# Patient Record
Sex: Male | Born: 1950 | Race: White | Hispanic: No | Marital: Married | Smoking: Never smoker
Health system: Southern US, Community
[De-identification: ages and names within clinical notes are randomized; demographics above are authoritative.]

## PROBLEM LIST (undated history)

## (undated) DIAGNOSIS — K5790 Diverticulosis of intestine, part unspecified, without perforation or abscess without bleeding: Secondary | ICD-10-CM

## (undated) DIAGNOSIS — Z87442 Personal history of urinary calculi: Secondary | ICD-10-CM

## (undated) DIAGNOSIS — I10 Essential (primary) hypertension: Secondary | ICD-10-CM

## (undated) DIAGNOSIS — R0789 Other chest pain: Secondary | ICD-10-CM

## (undated) DIAGNOSIS — I2510 Atherosclerotic heart disease of native coronary artery without angina pectoris: Secondary | ICD-10-CM

## (undated) DIAGNOSIS — M51369 Other intervertebral disc degeneration, lumbar region without mention of lumbar back pain or lower extremity pain: Secondary | ICD-10-CM

## (undated) DIAGNOSIS — K4020 Bilateral inguinal hernia, without obstruction or gangrene, not specified as recurrent: Secondary | ICD-10-CM

## (undated) DIAGNOSIS — I700 Atherosclerosis of aorta: Secondary | ICD-10-CM

## (undated) DIAGNOSIS — Z7901 Long term (current) use of anticoagulants: Secondary | ICD-10-CM

## (undated) DIAGNOSIS — M109 Gout, unspecified: Secondary | ICD-10-CM

## (undated) DIAGNOSIS — K760 Fatty (change of) liver, not elsewhere classified: Secondary | ICD-10-CM

## (undated) DIAGNOSIS — N400 Enlarged prostate without lower urinary tract symptoms: Secondary | ICD-10-CM

## (undated) DIAGNOSIS — Z7982 Long term (current) use of aspirin: Secondary | ICD-10-CM

## (undated) DIAGNOSIS — R918 Other nonspecific abnormal finding of lung field: Secondary | ICD-10-CM

## (undated) DIAGNOSIS — Z789 Other specified health status: Secondary | ICD-10-CM

## (undated) DIAGNOSIS — K449 Diaphragmatic hernia without obstruction or gangrene: Secondary | ICD-10-CM

## (undated) DIAGNOSIS — K8050 Calculus of bile duct without cholangitis or cholecystitis without obstruction: Secondary | ICD-10-CM

## (undated) DIAGNOSIS — Z8673 Personal history of transient ischemic attack (TIA), and cerebral infarction without residual deficits: Secondary | ICD-10-CM

## (undated) DIAGNOSIS — E785 Hyperlipidemia, unspecified: Secondary | ICD-10-CM

## (undated) HISTORY — DX: Personal history of transient ischemic attack (TIA), and cerebral infarction without residual deficits: Z86.73

## (undated) HISTORY — PX: MELANOMA EXCISION: SHX5266

---

## 2010-01-04 DIAGNOSIS — C4359 Malignant melanoma of other part of trunk: Secondary | ICD-10-CM

## 2010-01-04 HISTORY — DX: Malignant melanoma of other part of trunk: C43.59

## 2011-04-07 DIAGNOSIS — L280 Lichen simplex chronicus: Secondary | ICD-10-CM

## 2011-04-07 HISTORY — DX: Lichen simplex chronicus: L28.0

## 2012-04-06 DIAGNOSIS — I639 Cerebral infarction, unspecified: Secondary | ICD-10-CM

## 2012-04-06 DIAGNOSIS — C4492 Squamous cell carcinoma of skin, unspecified: Secondary | ICD-10-CM

## 2012-04-06 HISTORY — DX: Cerebral infarction, unspecified: I63.9

## 2012-04-06 HISTORY — DX: Squamous cell carcinoma of skin, unspecified: C44.92

## 2012-05-18 ENCOUNTER — Ambulatory Visit: Payer: Self-pay | Admitting: General Surgery

## 2012-05-20 LAB — PATHOLOGY REPORT

## 2013-01-30 ENCOUNTER — Inpatient Hospital Stay: Payer: Self-pay | Admitting: Internal Medicine

## 2013-01-30 ENCOUNTER — Ambulatory Visit: Payer: Self-pay | Admitting: Family Medicine

## 2013-01-30 DIAGNOSIS — I639 Cerebral infarction, unspecified: Secondary | ICD-10-CM

## 2013-01-30 HISTORY — DX: Cerebral infarction, unspecified: I63.9

## 2013-01-30 LAB — COMPREHENSIVE METABOLIC PANEL
Albumin: 3.9 g/dL (ref 3.4–5.0)
Alkaline Phosphatase: 79 U/L (ref 50–136)
Anion Gap: 7 (ref 7–16)
BUN: 18 mg/dL (ref 7–18)
Bilirubin,Total: 0.3 mg/dL (ref 0.2–1.0)
Calcium, Total: 8.8 mg/dL (ref 8.5–10.1)
Chloride: 108 mmol/L — ABNORMAL HIGH (ref 98–107)
Co2: 24 mmol/L (ref 21–32)
EGFR (African American): >60
Glucose: 108 mg/dL — ABNORMAL HIGH (ref 65–99)
Osmolality: 280 (ref 275–301)
Potassium: 4 mmol/L (ref 3.5–5.1)
SGOT(AST): 21 U/L (ref 15–37)
SGPT (ALT): 31 U/L (ref 12–78)
Total Protein: 7.5 g/dL (ref 6.4–8.2)

## 2013-01-30 LAB — CBC WITH DIFFERENTIAL/PLATELET
Eosinophil %: 1.8 %
HCT: 42.7 % (ref 40.0–52.0)
HGB: 14.6 g/dL (ref 13.0–18.0)
Lymphocyte %: 16.1 %
MCH: 30 pg (ref 26.0–34.0)
MCHC: 34.1 g/dL (ref 32.0–36.0)
Monocyte #: 0.6 x10 3/mm (ref 0.2–1.0)
Neutrophil %: 74.3 %
Platelet: 267 10*3/uL (ref 150–440)
RBC: 4.86 10*6/uL (ref 4.40–5.90)
RDW: 13.1 % (ref 11.5–14.5)

## 2013-01-30 LAB — CBC
HGB: 15 g/dL (ref 13.0–18.0)
MCHC: 34.3 g/dL (ref 32.0–36.0)
Platelet: 280 10*3/uL (ref 150–440)
RBC: 5.05 10*6/uL (ref 4.40–5.90)
WBC: 8.8 10*3/uL (ref 3.8–10.6)

## 2013-01-30 LAB — BASIC METABOLIC PANEL
Anion Gap: 6 — ABNORMAL LOW (ref 7–16)
Chloride: 106 mmol/L (ref 98–107)
Co2: 27 mmol/L (ref 21–32)
EGFR (African American): >60
EGFR (Non-African Amer.): >60
Glucose: 97 mg/dL (ref 65–99)
Osmolality: 279 (ref 275–301)
Potassium: 3.9 mmol/L (ref 3.5–5.1)
Sodium: 139 mmol/L (ref 136–145)

## 2013-01-30 LAB — CK TOTAL AND CKMB (NOT AT ARMC): CK-MB: 1.6 ng/mL (ref 0.5–3.6)

## 2013-01-30 LAB — TROPONIN I: Troponin-I: <0.02 ng/mL

## 2013-01-31 DIAGNOSIS — I517 Cardiomegaly: Secondary | ICD-10-CM

## 2013-01-31 DIAGNOSIS — I5189 Other ill-defined heart diseases: Secondary | ICD-10-CM

## 2013-01-31 HISTORY — DX: Other ill-defined heart diseases: I51.89

## 2013-01-31 LAB — CBC WITH DIFFERENTIAL/PLATELET
Basophil #: 0 10*3/uL (ref 0.0–0.1)
Basophil %: 0.6 %
Eosinophil %: 3.1 %
HCT: 40.5 % (ref 40.0–52.0)
HGB: 14.2 g/dL (ref 13.0–18.0)
Lymphocyte #: 1.3 10*3/uL (ref 1.0–3.6)
Lymphocyte %: 17.7 %
MCV: 87 fL (ref 80–100)
Monocyte %: 9.7 %
Neutrophil %: 68.9 %
RBC: 4.66 10*6/uL (ref 4.40–5.90)
RDW: 13 % (ref 11.5–14.5)
WBC: 7.2 10*3/uL (ref 3.8–10.6)

## 2013-01-31 LAB — LIPID PANEL
Cholesterol: 154 mg/dL (ref 0–200)
VLDL Cholesterol, Calc: 23 mg/dL (ref 5–40)

## 2013-01-31 LAB — HEMOGLOBIN A1C: Hemoglobin A1C: 5.4 % (ref 4.2–6.3)

## 2013-01-31 LAB — BASIC METABOLIC PANEL
Anion Gap: 3 — ABNORMAL LOW (ref 7–16)
Chloride: 107 mmol/L (ref 98–107)
Osmolality: 279 (ref 275–301)

## 2013-01-31 LAB — TSH: Thyroid Stimulating Horm: 1.35 u[IU]/mL

## 2014-02-15 IMAGING — US US CAROTID DUPLEX BILAT
1 series · 14 of 24 positions shown · non-contrast
Comparison: none

Addendum Begins
REASON FOR EXAM: slurred speech
COMMENTS:

PROCEDURE:     US  - US CAROTID DOPPLER BILATERAL  - January 31, 2013  [DATE]
RESULT:
TECHNIQUE: Grayscale, duplex color flow Doppler, and spectral waveform
imaging was performed. Image documentation was also performed.

[Series 1: us carotid duplex bilat · 14 of 54 slices shown]
[im 1/54]
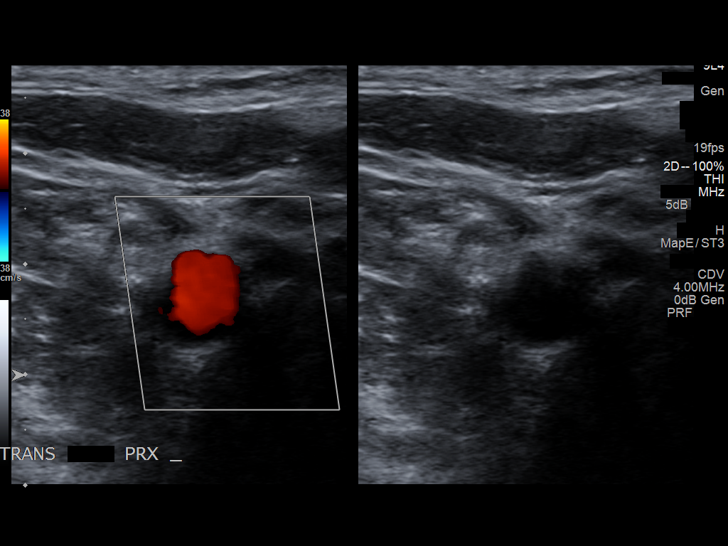
[im 5/54]
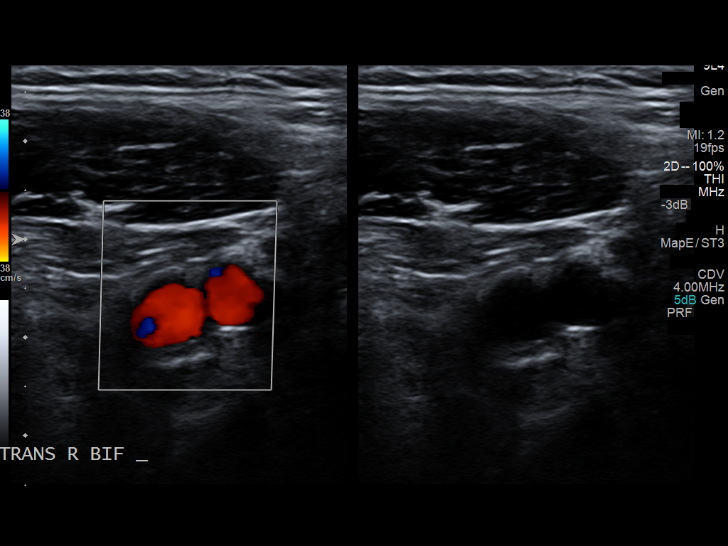
[im 10/54]
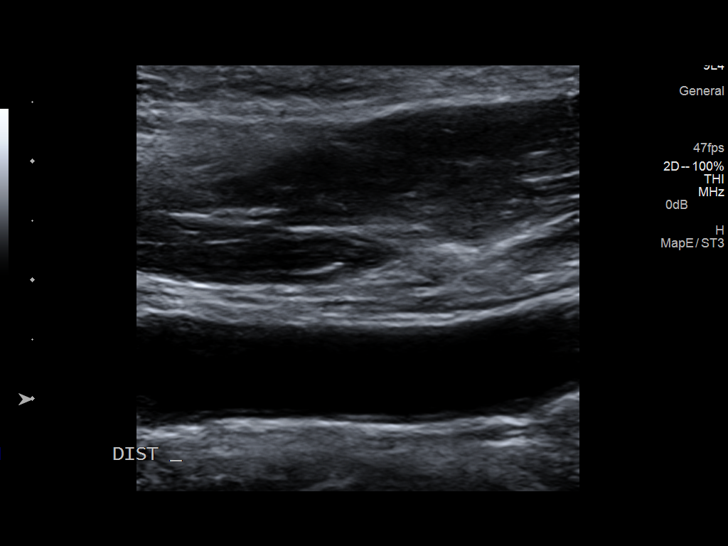
[im 14/54]
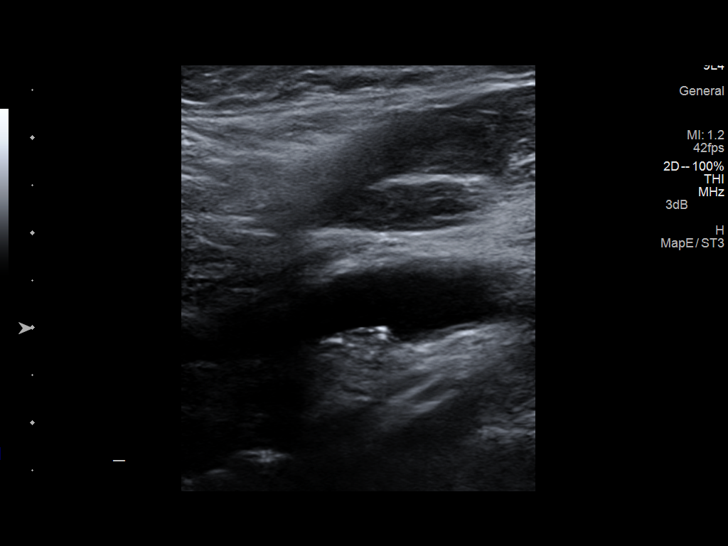
[im 17/54]
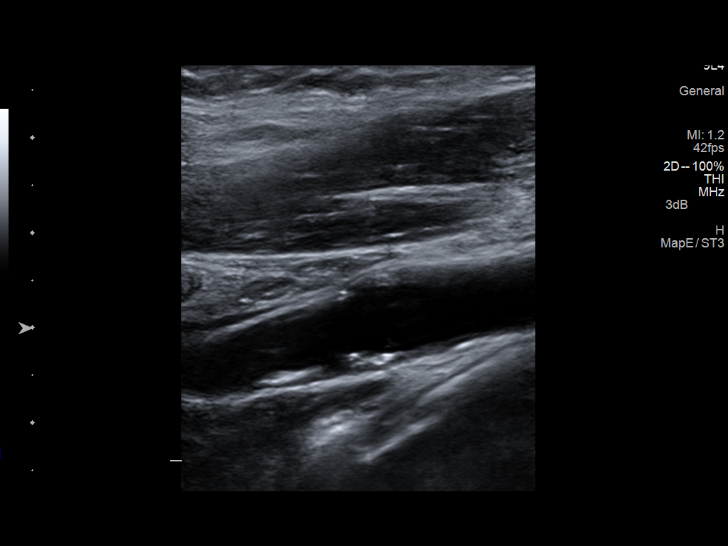
[im 21/54]
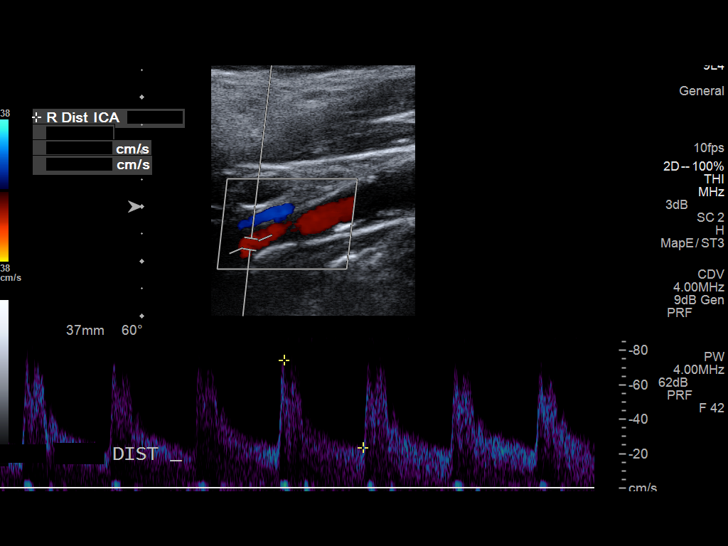
[im 26/54]
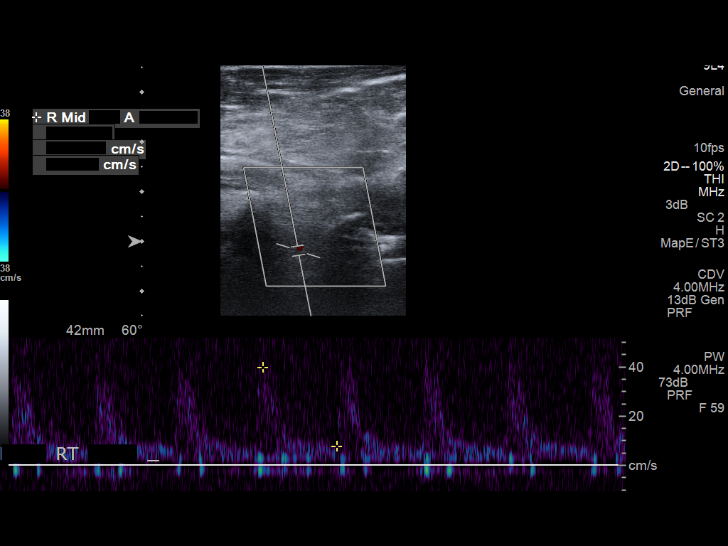
[im 28/54]
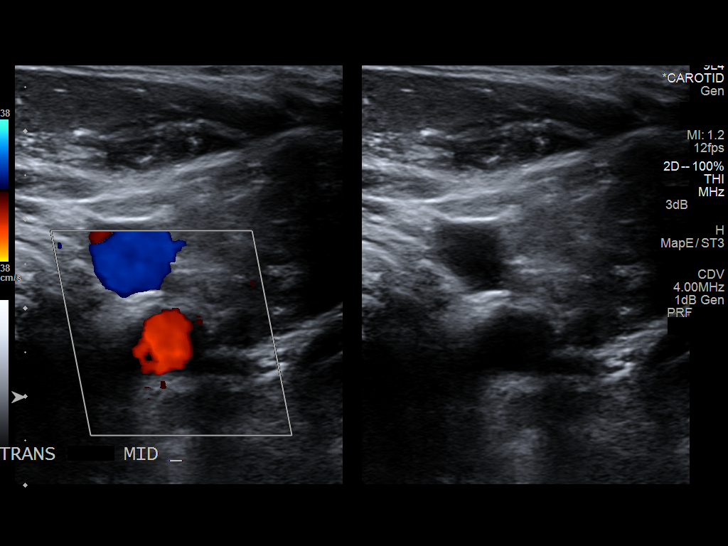
[im 33/54]
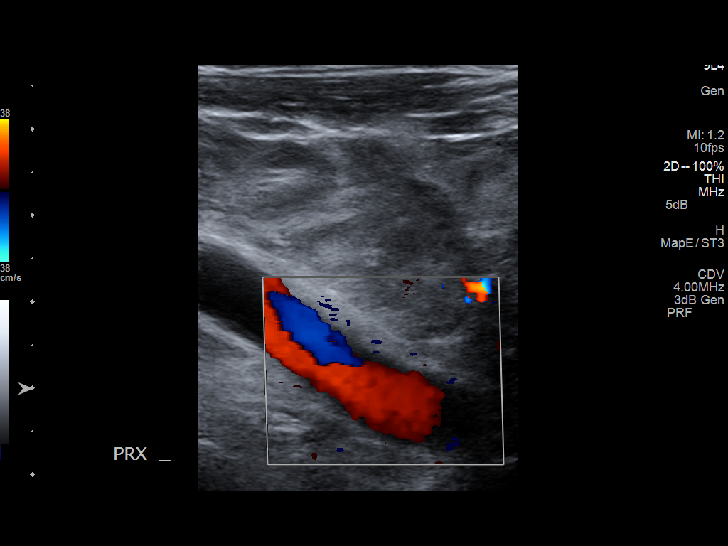
[im 37/54]
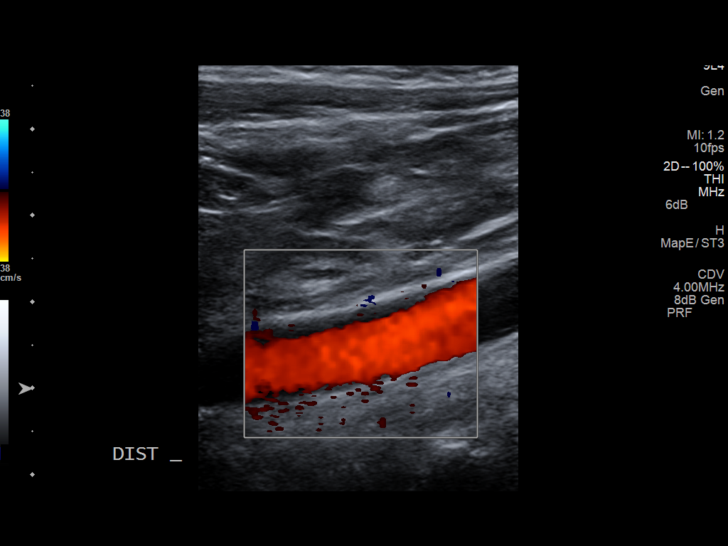
[im 42/54]
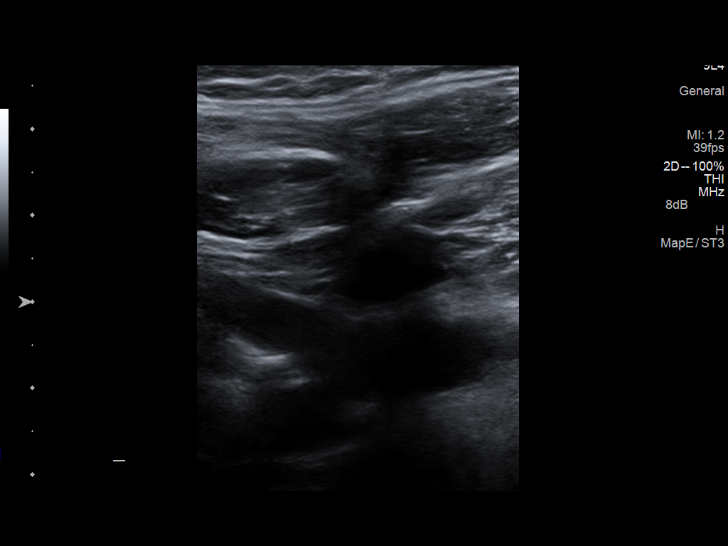
[im 44/54]
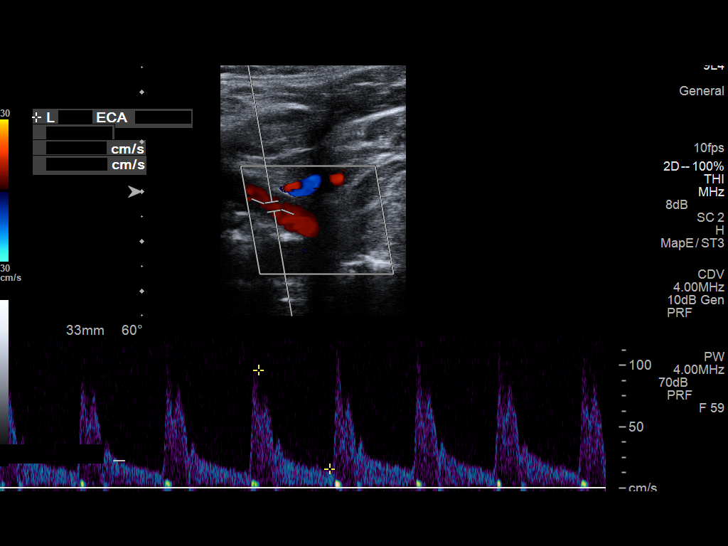
[im 49/54]
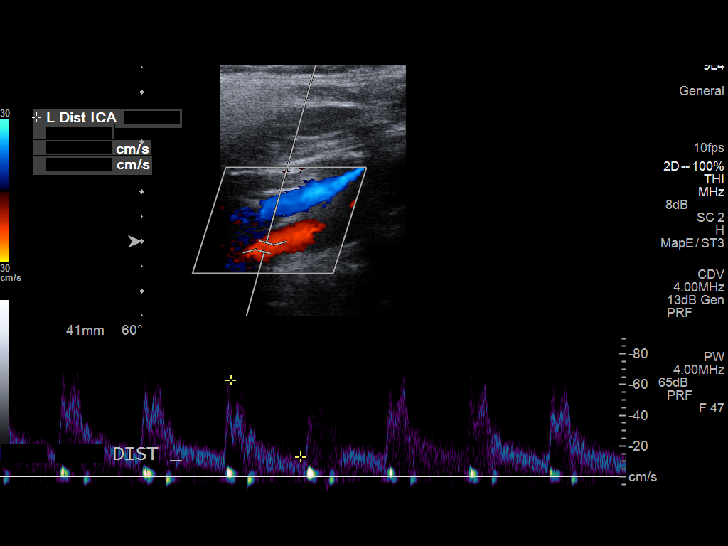
[im 54/54]
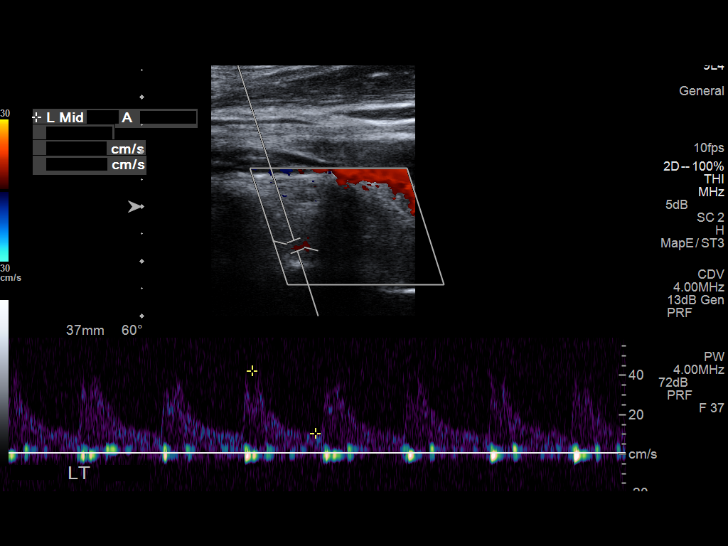

[14 of 24 positions shown; findings below may reference images not displayed]

FINDINGS: Asymmetric calcified plaques are identified within the carotid
bulb and internal carotid artery on the right and carotid bulb on the left.
There areas demonstrate less than 50% visual stenosis. Color filling and
spectral waveform imaging is unremarkable within the right and left carotid
systems.

ICA/CCA ratios:

RIGHT:

LEFT:

Antegrade flow is identified within the right and left vertebral arteries.
IMPRESSION: No sonographic evidence of hemodynamically significant
stenosis within the right or left carotid systems.

Addendum Ends

## 2014-07-27 NOTE — H&P (Signed)
PATIENT NAME:  Joseph Frye, Joseph Frye MR#:  [id number] DATE OF BIRTH:  01-20-1951  DATE OF ADMISSION:  01/30/2013  PRIMARY CARE PROVIDER: Jillene Bucks. Arlana Pouch, MD  REFERRING PHYSICIAN: Steele Sizer, MD  CHIEF COMPLAINT: Difficulty with speech.   HISTORY OF PRESENT ILLNESS: The patient is a 64 year old white male who states that he was previously healthy, was not on any medications except aspirin on a daily basis, who last Wednesday started developing difficulty with speech. He had difficulty expressing himself and could not say what he wanted to say.  Then his speech came back normal and then Saturday again had an episode of 20 minutes where he had difficulty with speech. He called his primary care provider yesterday. They told him to come to the office. 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 He was also noticed to have a blood pressure accelerated. Therefore, he was referred to the hospital for further evaluation. The patient reports that all his symptoms have resolved now. He does not have any weakness, does not have any speech changes, does not have any numbness. He is completely back to normal. The patient otherwise denies any difficulty swallowing or any visual difficulties. He denies any chest pain, shortness of breath or palpitations.   PAST MEDICAL HISTORY: No previous history of hypertension but was noted to have elevated blood pressure in the office and was started on Toprol today, which he has not filled yet.   PAST SURGICAL HISTORY: None.   ALLERGIES: None.   MEDICATION: He is on aspirin 81 mg, 1 tab p.o. daily.   SOCIAL HISTORY: History of smoking, quit 25 years ago. Drinks 6 to 7 beers daily. No drug use. He is retired.   FAMILY HISTORY: Father with CVA.   REVIEW OF SYSTEMS: CONSTITUTIONAL: Denies any weakness. No weight loss. No weight gain.  HEENT:  Denies any blurred vision. No cataracts. No glaucoma.  ENT: Denies any epistaxis, nasal drainage. No difficulty with swallowing. No difficulty hearing. No tinnitus.  CARDIOVASCULAR: Denies any chest pain, palpitations. No syncope. No arrhythmia.  PULMONARY: Denies any cough, wheezing, hemoptysis.  GASTROINTESTINAL: Denies any nausea, vomiting, diarrhea.  GENITOURINARY: Denies any frequency, urgency or hesitancy.  HEMATOLOGIC: Denies any anemia, easy bruisability or bleeding.  SKIN: Denies any rash.  LYMPHATICS: Denies any lymph node enlargement.  VASCULAR: Denies any claudication symptoms.  NEUROLOGIC: Denies any previous history of CVA or TIA or seizures.  PSYCHIATRIC: Denies anxiety, depression.   PHYSICAL EXAMINATION: VITAL SIGNS: Temperature 98.1, pulse 64, respirations 18, blood pressure 197/99, O2 97%.  GENERAL: The patient is a well-developed, well-nourished male in no acute distress.  HEENT: Head atraumatic, normocephalic. Pupils equally round, reactive to light and accommodation. There is no conjunctival pallor. No scleral icterus. Nasal exam shows no drainage or ulceration. Oropharynx is clear without any exudate.  NECK: Supple without any JVD. No carotid bruits.  CARDIOVASCULAR: Regular rate and rhythm. No murmurs, rubs, clicks or gallops. PMI is not displaced.   ABDOMEN: Soft, nontender, nondistended. Positive bowel sounds x 4. No hepatosplenomegaly.  SKIN: No rash.  LYMPHATICS: No lymph nodes palpable.  PSYCHIATRIC: Not anxious or depressed.  NEUROLOGIC: Awake, alert, oriented x 3. Cranial nerves II through XII grossly intact. Strength 5 out of 5 in all 4 extremities. Reflexes 2+.  Babinski downgoing.   EVALUATION: The only available evaluation that is  available is CT scan done earlier shows area consistent with a region of subacute to chronic infarcts in the basal ganglia region of the left.   ASSESSMENT AND PLAN: The patient is a 64 year old white male with no medical  history who last Wednesday developed expressive aphasia and then had recurrence of the symptoms on Saturday. CT scan is consistent with subacute stroke.  1.  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  The patient needs further evaluation including MRI of the brain. We will get carotid Dopplers, echocardiogram. Will place him on telemetry. Check EKG. He is on daily aspirin so I will change him to Aggrenox therapy.  2.  Accelerated hypertension. His stroke is greater than 48 hours ago so I will also start him on lisinopril. Will add p.r.n. hydralazine. We can add HCTZ as needed.  3.  Alcohol use daily. I will monitor for delirium tremens, place him on CIWA protocol.   TIME SPENT: 50 minutes spent on this patient.     ____________________________ Lacie ScottsShreyang H. Allena KatzPatel, MD shp:cs D: 01/30/2013 17:57:16 ET T: 01/30/2013 18:56:44 ET JOB#: [id number]  cc: Jaeger Trueheart H. Allena KatzPatel, MD, <Dictator> Charise CarwinSHREYANG H Judah Chevere MD ELECTRONICALLY SIGNED 01/31/2013 12:18

## 2014-07-27 NOTE — Discharge Summary (Signed)
PATIENT NAME:  Joseph RosebushBROWN, Emmanuell W MR#:  [id number] DATE OF BIRTH:  11-Dec-1950  DATE OF ADMISSION:  01/30/2013 DATE OF DISCHARGE:  01/31/2013  DISCHARGE DIAGNOSES: 1.  Acute left lenticular nucleus cerebrovascular accident.  2.  Hypertension.  3.  Elevated blood pressure without diagnosis of hypertension.  4.  Alcohol abuse.   CONSULTS: Dr. Katrinka BlazingSmith with neurology.   IMAGING STUDIES DONE: Include a CT scan of the head without contrast, which showed a region of subacute to chronic infarction within the basal ganglion region on the left side.   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 No significant stenosis of the carotids or vertebral arteries.     Echocardiogram showed normal EF. No thrombus.   ADMITTING HISTORY AND PHYSICAL: Please see the detailed H and P dictated by Dr. Allena KatzPatel on 01/30/2013. In brief, a 64 year old male patient presented to the hospital complaining of difficulty with speech, aphasia, which had resolved by the time the patient presented to the ER. CT scan of the head did show left basal ganglia area stroke, admitted to the hospitalist service.   HOSPITAL COURSE: 1.  Acute cerebral vascular accident: The patient had acute CVA found on the MRI in the left lenticular nucleus area, for which Dr. Katrinka BlazingSmith of neurology was contacted. After getting an MRI/MRA of the brain, he did have confirmation of his stroke on the MRI, which is acute, for which he let his blood pressure run high. No blood pressure medications were started for permissive hypertension.  The patient did have some stenosis in his posterior cerebral artery, which I discussed with Dr. Alverda SkeansMatt Smith, who suggested this can be treated medically. Prior to discharge, the patient's neurological examination shows motor strength of 5/5. No aphasia. Cranial nerves II through XII intact. The patient was started on Plavix, in addition to the  aspirin he takes, along with the statin medication, and he has been set up with an appointment with Dr. Sherryll BurgerShah of neurology for followup and primary care physician.   DISCHARGE MEDICATIONS: Include:  1.  Aspirin 81 mg daily.  2.  Plavix 75 mg daily.  3.  Lipitor 20 mg daily.   DISCHARGE INSTRUCTIONS: Low-sodium, low-fat diet. Activity as tolerated. I have explained to the patient to watch out for any strokelike symptoms and have a low threshold to return to the Emergency Room. Follow up with Dr. Sherryll BurgerShah of neurology.   Time spent on day of discharge in discharge activity was 40 minutes.    ____________________________ Molinda BailiffSrikar R. Absalom Aro, MD srs:dmm D: 02/01/2013 09:55:14 ET T: 02/01/2013 10:24:07 ET JOB#: [id number]  cc: Wardell HeathSrikar R. Ailish Prospero, MD, <Dictator> Hemang K. Sherryll BurgerShah, MD Orie FishermanSRIKAR R Verlean Allport MD ELECTRONICALLY SIGNED 02/01/2013 13:12

## 2022-06-23 LAB — LAB REPORT - SCANNED: EGFR: 78

## 2023-01-06 LAB — LAB REPORT - SCANNED
A1c: 6
EGFR: 91
HM Hepatitis Screen: NEGATIVE
PSA, FREE: 0.91

## 2023-01-23 ENCOUNTER — Emergency Department
Admission: EM | Admit: 2023-01-23 | Discharge: 2023-01-23 | Disposition: A | Discharge status: Home / Self Care | Payer: Medicare Other | Attending: Emergency Medicine | Admitting: Emergency Medicine

## 2023-01-23 ENCOUNTER — Emergency Department: Payer: Medicare Other

## 2023-01-23 ENCOUNTER — Other Ambulatory Visit: Payer: Self-pay

## 2023-01-23 DIAGNOSIS — Z8673 Personal history of transient ischemic attack (TIA), and cerebral infarction without residual deficits: Secondary | ICD-10-CM | POA: Insufficient documentation

## 2023-01-23 DIAGNOSIS — Z7902 Long term (current) use of antithrombotics/antiplatelets: Secondary | ICD-10-CM | POA: Diagnosis not present

## 2023-01-23 DIAGNOSIS — R0789 Other chest pain: Secondary | ICD-10-CM | POA: Insufficient documentation

## 2023-01-23 DIAGNOSIS — I10 Essential (primary) hypertension: Secondary | ICD-10-CM | POA: Insufficient documentation

## 2023-01-23 HISTORY — DX: Essential (primary) hypertension: I10

## 2023-01-23 LAB — BASIC METABOLIC PANEL
Anion gap: 8 (ref 5–15)
BUN: 19 mg/dL (ref 8–23)
CO2: 25 mmol/L (ref 22–32)
Calcium: 9.3 mg/dL (ref 8.9–10.3)
Chloride: 104 mmol/L (ref 98–111)
Creatinine, Ser: 0.93 mg/dL (ref 0.61–1.24)
GFR, Estimated: >60 mL/min (ref >60)
Glucose, Bld: 149 mg/dL — ABNORMAL HIGH (ref 70–99)
Potassium: 4 mmol/L (ref 3.5–5.1)
Sodium: 137 mmol/L (ref 135–145)

## 2023-01-23 LAB — CBC
HCT: 46.3 % (ref 39.0–52.0)
Hemoglobin: 15.8 g/dL (ref 13.0–17.0)
MCH: 29.7 pg (ref 26.0–34.0)
MCHC: 34.1 g/dL (ref 30.0–36.0)
MCV: 87 fL (ref 80.0–100.0)
Platelets: 251 10*3/uL (ref 150–400)
RBC: 5.32 MIL/uL (ref 4.22–5.81)
RDW: 12.3 % (ref 11.5–15.5)
WBC: 6 10*3/uL (ref 4.0–10.5)
nRBC: 0 % (ref 0.0–0.2)

## 2023-01-23 LAB — TROPONIN I (HIGH SENSITIVITY)
Troponin I (High Sensitivity): 9 ng/L (ref <18)
Troponin I (High Sensitivity): 11 ng/L (ref <18)

## 2023-01-23 NOTE — Discharge Instructions (Addendum)
We placed a referral to cardiology so you should get a phone call to schedule an appointment to be seen in the clinic.  If you have any worsening symptoms, please return to the ED.

## 2023-01-23 NOTE — ED Provider Notes (Signed)
[facility] Provider Note    Event Date/Time   First MD Initiated Contact with Patient 01/23/23 0407     (approximate)   History   Chest Pain   HPI  Joseph Frye is a 72 y.o. male who presents to the ED for evaluation of Chest Pain   Patient with a history of hypertension presents to the ED alongside his wife for evaluation of a resolved episode of acute chest pain.  No cardiac history.  Only HTN, HLD, gout.  Previous remote stroke on Plavix only.  Patient reports feeling normal when he went to bed last night, awoke from sleep around 1 AM with centralized chest pain without accompanying symptoms that lasted 1-2 hours, has since resolved and he now feels "normal."  He reports no dyspnea, nausea, emesis, dizziness, radiation.   Feels fine right now.  Hypertensive in triage, downtrending  Physical Exam   Triage Vital Signs: ED Triage Vitals  Encounter Vitals Group     BP 01/23/23 0247 (!) 229/105     Systolic BP Percentile --      Diastolic BP Percentile --      Pulse Rate 01/23/23 0247 69     Resp 01/23/23 0247 18     Temp 01/23/23 0247 97.8 F (36.6 C)     Temp Source 01/23/23 0247 Oral     SpO2 01/23/23 0247 100 %     Weight 01/23/23 0255 230 lb (104.3 kg)     Height 01/23/23 0255 5\' 10"  (1.778 m)     Head Circumference --      Peak Flow --      Pain Score 01/23/23 0255 5     Pain Loc --      Pain Education --      Exclude from Growth Chart --     Most recent vital signs: Vitals:   01/23/23 0515 01/23/23 0530  BP:  (!) 188/87  Pulse: 60 61  Resp: 15   Temp:    SpO2: 98% 99%    General: Awake, no distress.  CV:  Good peripheral perfusion.  Resp:  Normal effort.  Abd:  No distention.  MSK:  No deformity noted.  Neuro:  No focal deficits appreciated. Other:     ED Results / Procedures / Treatments   Labs (all labs ordered are listed, but only abnormal results are displayed) Labs Reviewed  BASIC METABOLIC PANEL -  Abnormal; Notable for the following components:      Result Value   Glucose, Bld 149 (*)    All other components within normal limits  CBC  TROPONIN I (HIGH SENSITIVITY)  TROPONIN I (HIGH SENSITIVITY)    EKG Sinus rhythm with a rate of 67 bpm.  Normal axis and intervals.  No clear signs of acute ischemia. Only comparison, from 2014, similar  RADIOLOGY CXR interpreted by me without evidence of acute cardiopulmonary pathology.  Official radiology report(s): DG Chest 2 View  Result Date: 01/23/2023 CLINICAL DATA:  Chest pain starting at 0100 hours. Pain when the patient from sleep. Central chest burning. No radiation. EXAM: CHEST - 2 VIEW COMPARISON:  None Available. FINDINGS: Cardiac enlargement. No vascular congestion, edema, or consolidation. No pleural effusions. No pneumothorax. Mediastinal contours appear intact. IMPRESSION: Cardiac enlargement.  No evidence of active pulmonary disease. Electronically Signed   By: Burman Nieves M.D.   On: 01/23/2023 03:34    PROCEDURES and INTERVENTIONS:  .1-3 Lead EKG Interpretation  Performed by: Delton Prairie, MD  Authorized by: Delton Prairie, MD     Interpretation: normal     ECG rate:  62   ECG rate assessment: normal     Rhythm: sinus rhythm     Ectopy: none     Conduction: normal     Medications - No data to display   IMPRESSION / MDM / ASSESSMENT AND PLAN / ED COURSE  I reviewed the triage vital signs and the nursing notes.  Differential diagnosis includes, but is not limited to, ACS, PTX, PNA, muscle strain/spasm, PE, dissection, anxiety, pleural effusion  {Patient presents with symptoms of an acute illness or injury that is potentially life-threatening.  Moderate risk patient without cardiac history presents with resolved atypical chest discomfort, with a benign workup and suitable for outpatient management with cardiology follow-up.  Nonischemic EKG and 2 negative troponins.  No persistent pain I doubt PE.  Normal CBC and  metabolic panel.  I considered admission but I believe he is suitable for trial of outpatient management.  Referred to cardiology.  Discussed return precautions.  Clinical Course as of 01/23/23 7829  Sat Jan 23, 2023  0541 Reassessed, remains pain-free.  Discussed plan of care.  He is comfortable going home.  Discussed close return precautions, cardiology referral [DS]    Clinical Course User Index [DS] Delton Prairie, MD     FINAL CLINICAL IMPRESSION(S) / ED DIAGNOSES   Final diagnoses:  Other chest pain     Rx / DC Orders   ED Discharge Orders          Ordered    Ambulatory referral to Cardiology       Comments: If you have not heard from the Cardiology office within the next 72 hours please call [phone].   01/23/23 0540             Note:  This document was prepared using Dragon voice recognition software and may include unintentional dictation errors.   Delton Prairie, MD 01/23/23 [phone]

## 2023-01-23 NOTE — ED Triage Notes (Signed)
Pt reports CP started at 0100. Pt reports pain woke him from sleep. Pain is in center of chest (burning). Pt took 4-5 antacid mints which did not improve pain. Pt denies pain radiating anywhere. Pt denies any weakness/fatigue/dizziness/fever/N/V/D. Hx of HTN (managed with meds), Stroke (10 years ago).

## 2023-03-11 ENCOUNTER — Encounter: Payer: Self-pay | Admitting: Gastroenterology

## 2023-03-11 ENCOUNTER — Ambulatory Visit (INDEPENDENT_AMBULATORY_CARE_PROVIDER_SITE_OTHER): Payer: Medicare Other | Admitting: Gastroenterology

## 2023-03-11 VITALS — BP 198/77 | HR 66 | Temp 98.1°F | Ht 70.0 in | Wt 239.0 lb

## 2023-03-11 DIAGNOSIS — R1013 Epigastric pain: Secondary | ICD-10-CM

## 2023-03-11 DIAGNOSIS — Z8673 Personal history of transient ischemic attack (TIA), and cerebral infarction without residual deficits: Secondary | ICD-10-CM | POA: Insufficient documentation

## 2023-03-11 DIAGNOSIS — I639 Cerebral infarction, unspecified: Secondary | ICD-10-CM | POA: Insufficient documentation

## 2023-03-11 NOTE — Progress Notes (Signed)
Gastroenterology Consultation  Referring Provider:     Jaclyn Shaggy, MD Primary Care Physician:  Jaclyn Shaggy, MD Primary Gastroenterologist:  Dr. Servando Snare     Reason for Consultation:     Chest pain        HPI:   Joseph Frye is a 72 y.o. y/o male referred for consultation & management of chest pain by Dr. Arlana Pouch, Jillene Bucks, MD. T is patient comes in today after his wife called stating that the patient had gone to the emergency room on the 19th November at 1 AM and was released at 6 AM for what they thought was a heart attack but then was told that his heart was okay.  The patient reported that his primary care provider said it could be his gallbladder.  The emergency room had recommended the patient follow-up as an outpatient with cardiology.  The patient had symptoms with waxing and waning issues back in October.  The patient's PCP actually sent a referral to Dr. Mariah Milling and cardiology for the symptoms. The patient reports that he had the chest pain in the epigastric region that did not get better after taking some antacids and it woke him up from sleep.  He was not eating any greasy or fatty foods and he also reports that he did not have any right upper quadrant pain or radiation to his shoulder. The patient had a colonoscopy 10 years ago by a Biomedical engineer and has already been set up for a repeat colonoscopy by Dr. Norma Fredrickson early next year.  Despite him being established with a gastroenterologist his wife made an appointment with our office for his present problem. The patient has not followed up with cardiology because he states that they had never called him.  The second episode he reported to have started but then he feels like he caught it in time.  He is presently on Plavix for history of a stroke.  Past Medical History:  Diagnosis Date   HTN (hypertension)    Stroke (HCC) 2014    No past surgical history on file.  Prior to Admission medications   Not on File    No family history  on file.      Allergies as of 03/11/2023   (No Known Allergies)    Review of Systems:    All systems reviewed and negative except where noted in HPI.   Physical Exam:  There were no vitals taken for this visit. No LMP for male patient. General:   Alert,  Well-developed, well-nourished, pleasant and cooperative in NAD Head:  Normocephalic and atraumatic. Eyes:  Sclera clear, no icterus.   Conjunctiva pink. Ears:  Normal auditory acuity. Neck:  Supple; no masses or thyromegaly. Lungs:  Respirations even and unlabored.  Clear throughout to auscultation.   No wheezes, crackles, or rhonchi. No acute distress. Heart:  Regular rate and rhythm; no murmurs, clicks, rubs, or gallops. Abdomen:  Normal bowel sounds.  No bruits.  Soft, non-tender and non-distended without masses, hepatosplenomegaly or hernias noted.  No guarding or rebound tenderness.  Negative Carnett sign.   Rectal:  Deferred.  Pulses:  Normal pulses noted. Extremities:  No clubbing or edema.  No cyanosis. Neurologic:  Alert and oriented x3;  grossly normal neurologically. Skin:  Intact without significant lesions or rashes.  No jaundice. Lymph Nodes:  No significant cervical adenopathy. Psych:  Alert and cooperative. Normal mood and affect.  Imaging Studies: No results found.  Assessment and Plan:  Joseph Frye is a 72 y.o. y/o male who comes in today with concerns of possible gallbladder disease after he was seen in the ER and referred to cardiology.  The patient has not followed up with cardiology but due to his epigastric discomfort was sent to me for possible gallbladder disease.  The patient denies any frequent heartburn and states that he has GERD like symptoms less than once a month and it goes away with Tums.  The patient has been told that I do think he should follow through with his evaluation by cardiology prior to undergoing any GI endoscopic procedures.  He will be set up for a right upper quadrant  ultrasound and gallbladder emptying study.  The patient will follow-up with Dr. Norma Fredrickson in the future since he is already established with Dr. Norma Fredrickson for a colonoscopy coming up early next year.  The patient has been explained the plan and agrees with it.    Midge Minium, MD. Clementeen Graham    Note: This dictation was prepared with Dragon dictation along with smaller phrase technology. Any transcriptional errors that result from this process are unintentional.

## 2023-03-11 NOTE — Patient Instructions (Addendum)
The ultrasound and gall bladder emptying study has been scheduled for December 13th, you must arrive at 9:00 AM to register.   Location: [facility], Medical Mall entrance.  You can not have anything to eat or drink after 12 midnight the day before.  NO OPIATE prescription 6 hrs prior   If you need to cancel or reschedule, please call scheduling at [phone]

## 2023-03-19 ENCOUNTER — Other Ambulatory Visit: Payer: Medicare Other

## 2023-03-19 ENCOUNTER — Ambulatory Visit: Payer: Medicare Other

## 2023-03-24 ENCOUNTER — Ambulatory Visit
Admission: RE | Admit: 2023-03-24 | Discharge: 2023-03-24 | Disposition: A | Discharge status: Home / Self Care | Payer: Medicare Other | Source: Ambulatory Visit | Attending: Gastroenterology | Admitting: Gastroenterology

## 2023-03-24 ENCOUNTER — Encounter
Admission: RE | Admit: 2023-03-24 | Discharge: 2023-03-24 | Discharge status: Home / Self Care | Payer: Medicare Other | Source: Ambulatory Visit | Attending: Gastroenterology | Admitting: Gastroenterology

## 2023-03-24 ENCOUNTER — Other Ambulatory Visit: Payer: Self-pay

## 2023-03-24 DIAGNOSIS — R1013 Epigastric pain: Secondary | ICD-10-CM | POA: Insufficient documentation

## 2023-03-24 MED ORDER — TECHNETIUM TC 99M MEBROFENIN IV KIT
5.2000 | PACK | Freq: Once | INTRAVENOUS | Status: AC | PRN
  Administered 2023-03-24: 5.2 via INTRAVENOUS

## 2023-03-25 ENCOUNTER — Telehealth: Payer: Self-pay

## 2023-03-25 NOTE — Telephone Encounter (Signed)
Radiology called to report abnormal result for gall bladder emptying study    Midge Minium, MD I spoke to the patient's wife before 5 o'clock, and the patient and told them to go to the emergency department. It does not look like they followed those recommendations.

## 2023-03-26 ENCOUNTER — Other Ambulatory Visit
Admission: RE | Admit: 2023-03-26 | Discharge: 2023-03-26 | Disposition: A | Discharge status: Home / Self Care | Payer: Medicare Other | Attending: Surgery | Admitting: Surgery

## 2023-03-26 ENCOUNTER — Ambulatory Visit (INDEPENDENT_AMBULATORY_CARE_PROVIDER_SITE_OTHER): Payer: Medicare Other | Admitting: Surgery

## 2023-03-26 ENCOUNTER — Encounter: Payer: Self-pay | Admitting: Surgery

## 2023-03-26 VITALS — BP 194/80 | HR 59 | Temp 98.0°F | Ht 70.0 in

## 2023-03-26 DIAGNOSIS — R0789 Other chest pain: Secondary | ICD-10-CM

## 2023-03-26 DIAGNOSIS — R1013 Epigastric pain: Secondary | ICD-10-CM | POA: Diagnosis not present

## 2023-03-26 LAB — CBC WITH DIFFERENTIAL/PLATELET
Abs Immature Granulocytes: 0.02 10*3/uL (ref 0.00–0.07)
Basophils Absolute: 0 10*3/uL (ref 0.0–0.1)
Basophils Relative: 1 %
Eosinophils Absolute: 0.2 10*3/uL (ref 0.0–0.5)
Eosinophils Relative: 4 %
HCT: 46.7 % (ref 39.0–52.0)
Hemoglobin: 15.7 g/dL (ref 13.0–17.0)
Immature Granulocytes: 0 %
Lymphocytes Relative: 19 %
Lymphs Abs: 1.2 10*3/uL (ref 0.7–4.0)
MCH: 29.7 pg (ref 26.0–34.0)
MCHC: 33.6 g/dL (ref 30.0–36.0)
MCV: 88.4 fL (ref 80.0–100.0)
Monocytes Absolute: 0.5 10*3/uL (ref 0.1–1.0)
Monocytes Relative: 9 %
Neutro Abs: 4.3 10*3/uL (ref 1.7–7.7)
Neutrophils Relative %: 67 %
Platelets: 261 10*3/uL (ref 150–400)
RBC: 5.28 MIL/uL (ref 4.22–5.81)
RDW: 12.3 % (ref 11.5–15.5)
WBC: 6.3 10*3/uL (ref 4.0–10.5)
nRBC: 0 % (ref 0.0–0.2)

## 2023-03-26 LAB — COMPREHENSIVE METABOLIC PANEL
ALT: 18 U/L (ref 0–44)
AST: 14 U/L — ABNORMAL LOW (ref 15–41)
Albumin: 4.4 g/dL (ref 3.5–5.0)
Alkaline Phosphatase: 64 U/L (ref 38–126)
Anion gap: 10 (ref 5–15)
BUN: 18 mg/dL (ref 8–23)
CO2: 25 mmol/L (ref 22–32)
Calcium: 8.8 mg/dL — ABNORMAL LOW (ref 8.9–10.3)
Chloride: 102 mmol/L (ref 98–111)
Creatinine, Ser: 0.92 mg/dL (ref 0.61–1.24)
GFR, Estimated: >60 mL/min (ref >60)
Glucose, Bld: 114 mg/dL — ABNORMAL HIGH (ref 70–99)
Potassium: 3.9 mmol/L (ref 3.5–5.1)
Sodium: 137 mmol/L (ref 135–145)
Total Bilirubin: 0.5 mg/dL (ref <1.2)
Total Protein: 7.3 g/dL (ref 6.5–8.1)

## 2023-03-26 LAB — PROTIME-INR
INR: 1 (ref 0.8–1.2)
Prothrombin Time: 12.9 s (ref 11.4–15.2)

## 2023-03-26 NOTE — Patient Instructions (Addendum)
We would like for you to have lab work done. You may do this at [facility], go in through the Medical Mall entrance and let them know you have lab work that needs done.   We have scheduled you for a CT Scan of your Abdomen and Pelvis with contrast. This has been scheduled at [facility] on 04/01/23 . Please arrive at the Medical Mall entrance by 12:15 pm . If you need to reschedule your Scan, you may do so by calling [phone]. Please let us know if you reschedule your scan as we have to get authorization from your insurance for this.    We will have you follow up here in 3 weeks.

## 2023-03-26 NOTE — Progress Notes (Signed)
Patient ID: ROLF STANGLAND, male   DOB: June 16, 1950, 72 y.o.   MRN: [id number]  HPI Joseph Frye is a 72 y.o. male seen as an urgent referral by Dr. Servando Snare.  Reports that towards the end of October he had an episode of chest wall and abdominal pain that was atypical it was severe and lasted for a few hours.  This prompted a visit to the emergency room or appropriate cardiac workup was performed and failed to reveal any acute coronary syndrome.  He did go to the beach following this and had a recurrent episode.  He states that since then he has not had any abdominal or chest pains.  He is able to walk the dogs and he is able to eat a regular diet and have regular bowel movements.  Of note he does drink 5 Miller lights a day.  He did have an ultrasound as well as a HIDA scan that I have personally review showing no filling of the gallbladder on HIDA scan.  I personally believe this is a false positive due to his alcohol consumption.  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 Did have a chest x-ray showing some cardiomegaly.  Does have a pending cardiology appointment He is able to walk his dogs and apparently is able to perform more than 4 METS of activity without any shortness of breath or chest pain. Takes Plavix daily after he had a stroke several years ago    HPI  Past Medical History:  Diagnosis Date   History of stroke    HTN (hypertension)    Stroke (HCC) 2014    Past Surgical History:  Procedure Laterality Date   MELANOMA EXCISION      Family History  Problem Relation Age of Onset   Breast cancer Mother    AAA (abdominal aortic aneurysm) Father    Cervical cancer Sister    Breast cancer Maternal Grandmother    Heart attack Maternal Grandfather     Social History Social History   Tobacco Use   Smoking status: Never    Passive exposure: Never   Smokeless tobacco: Never  Substance Use Topics    Alcohol use: Yes    Comment: daily    No Known Allergies  Current Outpatient Medications  Medication Sig Dispense Refill   aspirin EC 81 MG tablet Take 81 mg by mouth daily. Swallow whole.     clopidogrel (PLAVIX) 75 MG tablet SMARTSIG:1.0 Tablet(s) By Mouth Daily     colchicine 0.6 MG tablet SMARTSIG:1.0 Tablet(s) By Mouth Daily     lisinopril (ZESTRIL) 10 MG tablet SMARTSIG:1.0 Tablet(s) By Mouth Twice Daily     meloxicam (MOBIC) 15 MG tablet Take 15 mg by mouth as needed.     Multiple Vitamins-Minerals (CENTRUM SILVER 50+MEN PO) Take by mouth.     nitroGLYCERIN (NITROSTAT) 0.4 MG SL tablet Place 0.4 mg under the tongue as needed.     rosuvastatin (CRESTOR) 5 MG tablet Take by mouth.     No current facility-administered medications for this visit.     Review of Systems Full ROS  was asked and was negative except for the information on the HPI  Physical Exam Blood pressure (!) 194/80, pulse (!) 59, temperature 98 F (36.7 C), height 5\' 10"  (1.778 m), SpO2 97%. CONSTITUTIONAL: NAD obese. EYES: Pupils are equal, round, Sclera are non-icteric. EARS, NOSE, MOUTH AND THROAT: The oral mucosa is pink and moist.  Hearing is intact to voice. LYMPH NODES:  Lymph nodes in the neck are normal. RESPIRATORY:  Lungs are clear. There is normal respiratory effort, with equal breath sounds bilaterally, and without pathologic use of accessory muscles. CARDIOVASCULAR: Heart is regular without murmurs, gallops, or rubs. GI: The abdomen is  soft, nontender, and nondistended. There are no palpable masses. There is no hepatosplenomegaly. There are normal bowel sounds. No Murphy sign, no peritonitis GU: Rectal deferred.   MUSCULOSKELETAL: Normal muscle strength and tone. No cyanosis. There is pedal edema to the mid leg SKIN: Turgor is good and there are no pathologic skin lesions or ulcers. He does have small non expanding ecchymosis on his arms and RUQ. NEUROLOGIC: Motor and sensation is grossly normal.  Cranial nerves are grossly intact. PSYCH:  Oriented to person, place and time. Affect is normal.  Data Reviewed  I have personally reviewed the patient's imaging, laboratory findings and medical records.    Assessment/Plan 72 year old male with prior history of atypical chest pain consistent with musculoskeletal origin.  He with no abdominal symptoms or GI symptoms.  Workup showing evidence of no feeling of the gallbladder by HIDA scan likely related to alcohol consumption.  I firmly believe this is a false positive result.  Ultrasound showing no evidence of his stones.  Do think given the uncertainty of his chest wall and abdominal pain we need to further investigate this with a CT scan of the abdomen pelvis.  I have also ordered a full set of LFTs INR and CBC as I do think that he is got a component of cirrhosis.  Encouraged him and his wife to follow-up with cardiology as previously requested.  There is no need for hospitalization and there is no need for surgical intervention at this time.  Please note that I spent 60 minutes in this encounter including extensive review of medical records, personally reviewing imaging studies, coordinating his care, placing orders and performing documentation. Also discussed with him about seeking PCP attention regarding his high blood pressure    Sterling Big, MD FACS General Surgeon 03/26/2023, 12:35 PM

## 2023-04-01 ENCOUNTER — Ambulatory Visit
Admission: RE | Admit: 2023-04-01 | Discharge: 2023-04-01 | Disposition: A | Discharge status: Home / Self Care | Payer: Medicare Other | Source: Ambulatory Visit | Attending: Surgery | Admitting: Surgery

## 2023-04-01 DIAGNOSIS — R1013 Epigastric pain: Secondary | ICD-10-CM | POA: Insufficient documentation

## 2023-04-01 MED ORDER — IOHEXOL 300 MG/ML  SOLN
100.0000 mL | Freq: Once | INTRAMUSCULAR | Status: AC | PRN
  Administered 2023-04-01: 100 mL via INTRAVENOUS

## 2023-04-19 ENCOUNTER — Telehealth: Payer: Self-pay

## 2023-04-19 ENCOUNTER — Ambulatory Visit (INDEPENDENT_AMBULATORY_CARE_PROVIDER_SITE_OTHER): Payer: Medicare Other | Admitting: Surgery

## 2023-04-19 ENCOUNTER — Encounter: Payer: Self-pay | Admitting: Surgery

## 2023-04-19 ENCOUNTER — Telehealth: Payer: Self-pay | Admitting: Surgery

## 2023-04-19 VITALS — BP 153/79 | HR 76 | Temp 98.0°F | Ht 70.0 in | Wt 236.0 lb

## 2023-04-19 DIAGNOSIS — R1013 Epigastric pain: Secondary | ICD-10-CM | POA: Diagnosis not present

## 2023-04-19 DIAGNOSIS — K8050 Calculus of bile duct without cholangitis or cholecystitis without obstruction: Secondary | ICD-10-CM

## 2023-04-19 NOTE — Telephone Encounter (Signed)
 The patient wife called in and left a voicemail requesting about a referral. She thinks that he has appointment for colonoscopy, and she needs a date with the provider's name. I called her back to let her know that we got her message, and I enter the Ext but I only dead air.

## 2023-04-19 NOTE — Patient Instructions (Addendum)
 You have requested to have your gallbladder removed. This will be done at [facility] with Dr. Jordis on 04/29/23  You will most likely be out of work 1-2 weeks for this surgery.  If you have FMLA or disability paperwork that needs filled out you may drop this off at our office or this can be faxed to [phone].  You will return after your post-op appointment with a lifting restriction for approximately 4 more weeks.  You will be able to eat anything you would like to following surgery. But, start by eating a bland diet and advance this as tolerated. The Gallbladder diet is below, please go as closely by this diet as possible prior to surgery to avoid any further attacks.  Please see the (blue)pre-care form that you have been given today. Our surgery scheduler will call you to verify surgery date and to go over information.   If you have any questions, please call our office.  Laparoscopic Cholecystectomy Laparoscopic cholecystectomy is surgery to remove the gallbladder. The gallbladder is located in the upper right part of the abdomen, behind the liver. It is a storage sac for bile, which is produced in the liver. Bile aids in the digestion and absorption of fats. Cholecystectomy is often done for inflammation of the gallbladder (cholecystitis). This condition is usually caused by a buildup of gallstones (cholelithiasis) in the gallbladder. Gallstones can block the flow of bile, and that can result in inflammation and pain. In severe cases, emergency surgery may be required. If emergency surgery is not required, you will have time to prepare for the procedure. Laparoscopic surgery is an alternative to open surgery. Laparoscopic surgery has a shorter recovery time. Your common bile duct may also need to be examined during the procedure. If stones are found in the common bile duct, they may be removed. LET [facility] CARE PROVIDER KNOW ABOUT: Any allergies you have. All medicines you are  taking, including vitamins, herbs, eye drops, creams, and over-the-counter medicines. Previous problems you or members of your family have had with the use of anesthetics. Any blood disorders you have. Previous surgeries you have had.  Any medical conditions you have. RISKS AND COMPLICATIONS Generally, this is a safe procedure. However, problems may occur, including: Infection. Bleeding. Allergic reactions to medicines. Damage to other structures or organs. A stone remaining in the common bile duct. A bile leak from the cyst duct that is clipped when your gallbladder is removed. The need to convert to open surgery, which requires a larger incision in the abdomen. This may be necessary if your surgeon thinks that it is not safe to continue with a laparoscopic procedure. BEFORE THE PROCEDURE Ask your health care provider about: Changing or stopping your regular medicines. This is especially important if you are taking diabetes medicines or blood thinners. Taking medicines such as aspirin and ibuprofen. These medicines can thin your blood. Do not take these medicines before your procedure if your health care provider instructs you not to. Follow instructions from your health care provider about eating or drinking restrictions. Let your health care provider know if you develop a cold or an infection before surgery. Plan to have someone take you home after the procedure. Ask your health care provider how your surgical site will be marked or identified. You may be given antibiotic medicine to help prevent infection. PROCEDURE To reduce your risk of infection: Your health care team will wash or sanitize their hands. Your skin will be washed with soap.  An IV tube may be inserted into one of your veins. You will be given a medicine to make you fall asleep (general anesthetic). A breathing tube will be placed in your mouth. The surgeon will make several small cuts (incisions) in your abdomen. A  thin, lighted tube (laparoscope) that has a tiny camera on the end will be inserted through one of the small incisions. The camera on the laparoscope will send a picture to a TV screen (monitor) in the operating room. This will give the surgeon a good view inside your abdomen. A gas will be pumped into your abdomen. This will expand your abdomen to give the surgeon more room to perform the surgery. Other tools that are needed for the procedure will be inserted through the other incisions. The gallbladder will be removed through one of the incisions. After your gallbladder has been removed, the incisions will be closed with stitches (sutures), staples, or skin glue. Your incisions may be covered with a bandage (dressing). The procedure may vary among health care providers and hospitals. AFTER THE PROCEDURE Your blood pressure, heart rate, breathing rate, and blood oxygen level will be monitored often until the medicines you were given have worn off. You will be given medicines as needed to control your pain.   This information is not intended to replace advice given to you by your health care provider. Make sure you discuss any questions you have with your health care provider.   Document Released: 03/23/2005 Document Revised: 12/12/2014 Document Reviewed: 11/02/2012 Elsevier Interactive Patient Education 2016 Else[employer] Inc[employer]   Low-Fat Diet for Gallbladder Conditions A low-fat diet can be helpful if you have pancreatitis or a gallbladder condition. With these conditions, your pancreas and gallbladder have trouble digesting fats. A healthy eating plan with less fat will help rest your pancreas and gallbladder and reduce your symptoms. WHAT DO I NEED TO KNOW ABOUT THIS DIET? Eat a low-fat diet. Reduce your fat intake to less than 20-30% of your total daily calories. This is less than 50-60 g of fat per day. Remember that you need some fat in your diet. Ask your dietician what your daily goal should  be. Choose nonfat and low-fat healthy foods. Look for the words nonfat, low fat, or fat free. As a guide, look on the label and choose foods with less than 3 g of fat per serving. Eat only one serving. Avoid alcohol. Do not smoke. If you need help quitting, talk with your health care provider. Eat small frequent meals instead of three large heavy meals. WHAT FOODS CAN I EAT? Grains Include healthy grains and starches such as potatoes, wheat bread, fiber-rich cereal, and [place] rice. Choose whole grain options whenever possible. In adults, whole grains should account for 45-65% of your daily calories.  Fruits and Vegetables Eat plenty of fruits and vegetables. Fresh fruits and vegetables add fiber to your diet. Meats and Other Protein Sources Eat lean meat such as chicken and pork. Trim any fat off of meat before cooking it. Eggs, fish, and beans are other sources of protein. In adults, these foods should account for 10-35% of your daily calories. Dairy Choose low-fat milk and dairy options. Dairy includes fat and protein, as well as calcium.  Fats and Oils Limit high-fat foods such as fried foods, sweets, baked goods, sugary drinks.  Other Creamy sauces and condiments, such as mayonnaise, can add extra fat. Think about whether or not you need to use them, or use smaller amounts or low  fat options. WHAT FOODS ARE NOT RECOMMENDED? High fat foods, such as: [employer]. Ice cream. French toast. Sweet rolls. Pizza. Cheese bread. Foods covered with batter, butter, creamy sauces, or cheese. Fried foods. Sugary drinks and desserts. Foods that cause gas or bloating   This information is not intended to replace advice given to you by your health care provider. Make sure you discuss any questions you have with your health care provider.   Document Released: 03/28/2013 Document Reviewed: 03/28/2013 Elsevier Interactive Patient Education [employer]

## 2023-04-19 NOTE — Telephone Encounter (Signed)
 Spoke with wife, Clarita, they have been informed of the following regarding scheduled surgery with Dr. Jordis.   Patient has been advised of Pre-Admission date/time, and Surgery date at [facility].  Surgery Date: 04/29/23 Preadmission Testing Date: 04/23/23 (phone 1p-4p)  Patient has been made aware to call [phone], between 1-3:00pm the day before surgery, to find out what time to arrive for surgery.

## 2023-04-21 NOTE — Progress Notes (Signed)
 Outpatient Surgical Follow Up    Joseph Frye is an 73 y.o. male.   Chief Complaint  Patient presents with   Follow-up    HPI: Joseph Frye is a 73 y.o. male seen as an urgent referral by Dr. Servando Snare.  Reports that towards the end of October he had an episode of chest wall and abdominal pain that was atypical it was severe and lasted for a few hours.  This prompted a visit to the emergency room or appropriate cardiac workup was performed and failed to reveal any acute coronary syndrome.  He did go to the beach following this and had a recurrent episode.  He states that since then he has not had any abdominal or chest pains.  He is able to walk the dogs and he is able to eat a regular diet and have regular bowel movements.  Of note he does drink 5 Miller lights a day.  He did have an ultrasound as well as a HIDA scan that I have personally review showing no filling of the gallbladder on HIDA scan.  I personally believe this is a false positive due to his alcohol consumption.  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 He is able to walk his dogs and apparently is able to perform more than 4 METS of activity without any shortness of breath or chest pain. Takes Plavix daily after he had a stroke several years ago Recently states that his pain came back this time in the epigastric and chest wall region and seem to have been in the middle of the night.  This lasted for about half an hour and then went away.  He did completed a CT scan of the abdomen pelvis that  have personally reviewed showing no evidence of cirrhosis.  There is questionable stones versus sludge versus remaining contrast.  No other acute intra-abdominal issues.  Past Medical History:  Diagnosis Date   History of stroke    HTN (hypertension)    Stroke (HCC) 2014    Past Surgical History:  Procedure Laterality Date   MELANOMA EXCISION       Family History  Problem Relation Age of Onset   Breast cancer Mother    AAA (abdominal aortic aneurysm) Father    Cervical cancer Sister    Breast cancer Maternal Grandmother    Heart attack Maternal Grandfather     Social History:  reports that he has never smoked. He has never been exposed to tobacco smoke. He has never used smokeless tobacco. He reports current alcohol use. No history on file for drug use.  Allergies: No Known Allergies  Medications reviewed.    ROS Full ROS performed and is otherwise negative other than what is stated in HPI   BP (!) 153/79   Pulse 76   Temp 98 F (36.7 C)   Ht 5\' 10"  (1.778 m)   Wt 236 lb (107 kg)   SpO2 99%   BMI 33.86 kg/m   Physical Exam CONSTITUTIONAL: NAD obese. EYES: Pupils are equal, round, Sclera are non-icteric. EARS, NOSE, MOUTH AND THROAT: The oral mucosa is pink and moist. Hearing is intact to voice. LYMPH NODES:  Lymph nodes in the neck are normal. RESPIRATORY:  Lungs are clear. There is normal respiratory effort, with equal breath sounds bilaterally, and without pathologic use of accessory muscles. CARDIOVASCULAR: Heart is regular without murmurs, gallops, or rubs. GI: The abdomen is  soft, nontender, and nondistended. There are no palpable masses. There is no hepatosplenomegaly. There are normal bowel sounds. No Murphy sign, no peritonitis GU: Rectal deferred.   MUSCULOSKELETAL: Normal muscle strength and tone. No cyanosis.  SKIN: Turgor is good and there are no pathologic skin lesions or ulcers.NEUROLOGIC: Motor and sensation is grossly normal. Cranial nerves are grossly intact. PSYCH:  Oriented to person, place and time. Affect is normal.   Assessment/Plan: 73 year old male with recurrent epigastric pain suspicious for for biliary colic.  Clinical picture is definitely not clear-cut he did have a positive HIDA suggestive of acute cholecystitis followed by a CT scan without any major significant abnormalities  other than potential sludge or stones.  He Is not cirrhotic. No unusual clinical pictures.  The only thing so far in the workup seems to be correlated with his gallbladder.  Given persistent symptoms and lack of evidence of any other intra-abdominal pathology I do think that is very reasonable to proceed with cholecystectomy.  I do think that he is a very good candidate for robotic approach. The risks, benefits, complications, treatment options, and expected outcomes were discussed with the patient. The possibilities of bleeding, recurrent infection, finding a normal gallbladder, perforation of viscus organs, damage to surrounding structures, bile leak, abscess formation, needing a drain placed, the need for additional procedures, reaction to medication, pulmonary aspiration,  failure to diagnose a condition, the possible need to convert to an open procedure, and creating a complication requiring transfusion or operation were discussed with the patient. The patient and/or family concurred with the proposed plan, giving informed consent.  I Spent 40 minutes in this encounter including personally reviewing imaging studies, coordinating his care, placing orders, counseling the patient and performing documentation   Sterling Big, MD [facility] General Surgeon

## 2023-04-21 NOTE — H&P (View-Only) (Signed)
Outpatient Surgical Follow Up    Joseph Frye is an 73 y.o. male.   Chief Complaint  Patient presents with   Follow-up    HPI: Joseph Frye is a 73 y.o. male seen as an urgent referral by Dr. Servando Snare.  Reports that towards the end of October he had an episode of chest wall and abdominal pain that was atypical it was severe and lasted for a few hours.  This prompted a visit to the emergency room or appropriate cardiac workup was performed and failed to reveal any acute coronary syndrome.  He did go to the beach following this and had a recurrent episode.  He states that since then he has not had any abdominal or chest pains.  He is able to walk the dogs and he is able to eat a regular diet and have regular bowel movements.  Of note he does drink 5 Miller lights a day.  He did have an ultrasound as well as a HIDA scan that I have personally review showing no filling of the gallbladder on HIDA scan.  I personally believe this is a false positive due to his alcohol consumption.  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 He is able to walk his dogs and apparently is able to perform more than 4 METS of activity without any shortness of breath or chest pain. Takes Plavix daily after he had a stroke several years ago Recently states that his pain came back this time in the epigastric and chest wall region and seem to have been in the middle of the night.  This lasted for about half an hour and then went away.  He did completed a CT scan of the abdomen pelvis that  have personally reviewed showing no evidence of cirrhosis.  There is questionable stones versus sludge versus remaining contrast.  No other acute intra-abdominal issues.  Past Medical History:  Diagnosis Date   History of stroke    HTN (hypertension)    Stroke (HCC) 2014    Past Surgical History:  Procedure Laterality Date   MELANOMA EXCISION       Family History  Problem Relation Age of Onset   Breast cancer Mother    AAA (abdominal aortic aneurysm) Father    Cervical cancer Sister    Breast cancer Maternal Grandmother    Heart attack Maternal Grandfather     Social History:  reports that he has never smoked. He has never been exposed to tobacco smoke. He has never used smokeless tobacco. He reports current alcohol use. No history on file for drug use.  Allergies: No Known Allergies  Medications reviewed.    ROS Full ROS performed and is otherwise negative other than what is stated in HPI   BP (!) 153/79   Pulse 76   Temp 98 F (36.7 C)   Ht 5\' 10"  (1.778 m)   Wt 236 lb (107 kg)   SpO2 99%   BMI 33.86 kg/m   Physical Exam CONSTITUTIONAL: NAD obese. EYES: Pupils are equal, round, Sclera are non-icteric. EARS, NOSE, MOUTH AND THROAT: The oral mucosa is pink and moist. Hearing is intact to voice. LYMPH NODES:  Lymph nodes in the neck are normal. RESPIRATORY:  Lungs are clear. There is normal respiratory effort, with equal breath sounds bilaterally, and without pathologic use of accessory muscles. CARDIOVASCULAR: Heart is regular without murmurs, gallops, or rubs. GI: The abdomen is  soft, nontender, and nondistended. There are no palpable masses. There is no hepatosplenomegaly. There are normal bowel sounds. No Murphy sign, no peritonitis GU: Rectal deferred.   MUSCULOSKELETAL: Normal muscle strength and tone. No cyanosis.  SKIN: Turgor is good and there are no pathologic skin lesions or ulcers.NEUROLOGIC: Motor and sensation is grossly normal. Cranial nerves are grossly intact. PSYCH:  Oriented to person, place and time. Affect is normal.   Assessment/Plan: 73 year old male with recurrent epigastric pain suspicious for for biliary colic.  Clinical picture is definitely not clear-cut he did have a positive HIDA suggestive of acute cholecystitis followed by a CT scan without any major significant abnormalities  other than potential sludge or stones.  He Is not cirrhotic. No unusual clinical pictures.  The only thing so far in the workup seems to be correlated with his gallbladder.  Given persistent symptoms and lack of evidence of any other intra-abdominal pathology I do think that is very reasonable to proceed with cholecystectomy.  I do think that he is a very good candidate for robotic approach. The risks, benefits, complications, treatment options, and expected outcomes were discussed with the patient. The possibilities of bleeding, recurrent infection, finding a normal gallbladder, perforation of viscus organs, damage to surrounding structures, bile leak, abscess formation, needing a drain placed, the need for additional procedures, reaction to medication, pulmonary aspiration,  failure to diagnose a condition, the possible need to convert to an open procedure, and creating a complication requiring transfusion or operation were discussed with the patient. The patient and/or family concurred with the proposed plan, giving informed consent.  I Spent 40 minutes in this encounter including personally reviewing imaging studies, coordinating his care, placing orders, counseling the patient and performing documentation   Sterling Big, MD [facility] General Surgeon

## 2023-04-23 ENCOUNTER — Other Ambulatory Visit: Payer: Self-pay | Admitting: Urgent Care

## 2023-04-23 ENCOUNTER — Other Ambulatory Visit: Payer: Self-pay

## 2023-04-23 ENCOUNTER — Encounter
Admission: RE | Admit: 2023-04-23 | Discharge: 2023-04-23 | Disposition: A | Discharge status: Home / Self Care | Payer: Medicare Other | Source: Ambulatory Visit | Attending: Surgery | Admitting: Surgery

## 2023-04-23 ENCOUNTER — Encounter: Payer: Self-pay | Admitting: Surgery

## 2023-04-23 DIAGNOSIS — R0789 Other chest pain: Secondary | ICD-10-CM

## 2023-04-23 HISTORY — DX: Personal history of urinary calculi: Z87.442

## 2023-04-23 HISTORY — DX: Other chest pain: R07.89

## 2023-04-23 NOTE — Patient Instructions (Addendum)
Your procedure is scheduled on: Jan 23 Thursday Report to the Registration Desk on the 1st floor of the [facility] To find out your arrival time, please call [phone] between 1PM - 3PM on: Jan 22, Wednesday If your arrival time is 6:00 am, do not arrive before that time as the [facility] entrance doors do not open until 6:00 am.  REMEMBER: Instructions that are not followed completely may result in serious medical risk, up to and including death; or upon the discretion of your surgeon and anesthesiologist your surgery may need to be rescheduled.  Do not eat food after midnight the night before surgery.  No gum chewing or hard candies.  You may however, drink CLEAR liquids up to 2 hours before you are scheduled to arrive for your surgery. Do not drink anything within 2 hours of your scheduled arrival time.  Clear liquids include: - water  - apple juice without pulp - gatorade (not RED colors) - black coffee or tea (Do NOT add milk or creamers to the coffee or tea) Do NOT drink anything that is not on this list.    One week prior to surgery: Stop Anti-inflammatories (NSAIDS) such as Advil, Aleve, Ibuprofen, Motrin, Naproxen, Naprosyn and Aspirin based products such as Excedrin, Goody's Powder, BC Powder meloxicam Stop ANY OVER THE COUNTER supplements until after surgery multivitamins, Q10   You may however, continue to take Tylenol if needed for pain up until the day of surgery.    You were instructed to stop plavix and aspirin by Dr. Everlene Farrier. So please follow that correctly.   Continue taking all of your other prescription medications up until the day of surgery.    No Alcohol for 24 hours before or after surgery.  No Smoking including e-cigarettes for 24 hours before surgery.  No chewable tobacco products for at least 6 hours before surgery.  No nicotine patches on the day of surgery.  Do not use any "recreational" drugs for at least a week (preferably 2 weeks)  before your surgery.  Please be advised that the combination of cocaine and anesthesia may have negative outcomes, up to and including death. If you test positive for cocaine, your surgery will be cancelled.  On the morning of surgery brush your teeth with toothpaste and water, you may rinse your mouth with mouthwash if you wish. Do not swallow any toothpaste or mouthwash.  Use CHG Soap or wipes as directed on instruction sheet.- Provided for you  Do not wear jewelry, make-up, hairpins, clips or nail polish.  For welded (permanent) jewelry: bracelets, anklets, waist bands, etc.  Please have this removed prior to surgery.  If it is not removed, there is a chance that hospital personnel will need to cut it off on the day of surgery.  Do not wear lotions, powders, or perfumes.   Do not shave body hair from the neck down 48 hours before surgery.  Contact lenses, hearing aids and dentures may not be worn into surgery.  Do not bring valuables to the hospital. [facility] is not responsible for any missing/lost belongings or valuables.   Notify your doctor if there is any change in your medical condition (cold, fever, infection).  Wear comfortable clothing (specific to your surgery type) to the hospital.  After surgery, you can help prevent lung complications by doing breathing exercises.  Take deep breaths and cough every 1-2 hours. Your doctor may order a device called an Incentive Spirometer to help you take deep breaths.  If you are being admitted to the hospital overnight, leave your suitcase in the car. After surgery it may be brought to your room.  If you are being discharged the day of surgery, you will not be allowed to drive home. You will need a responsible individual to drive you home and stay with you for 24 hours after surgery.   If you are taking public transportation, you will need to have a responsible individual with you.  Please call the Pre-admissions Testing Dept. at  [phone] if you have any questions about these instructions.  Surgery Visitation Policy:  Patients having surgery or a procedure may have two visitors.  Children under the age of 20 must have an adult with them who is not the patient.  Temporary Visitor Restrictions Due to increasing cases of flu, RSV and COVID-19: Children ages 72 and under will not be able to visit patients in [facility] hospitals under most circumstances.  Inpatient Visitation:    Visiting hours are 7 a.m. to 8 p.m. Up to four visitors are allowed at one time in a patient room. The visitors may rotate out with other people during the day.  One visitor age 8 or older may stay with the patient overnight and must be in the room by 8 p.m.      Preparing for Surgery with CHLORHEXIDINE GLUCONATE (CHG) Soap  Chlorhexidine Gluconate (CHG) Soap  o An antiseptic cleaner that kills germs and bonds with the skin to continue killing germs even after washing  o Used for showering the night before surgery and morning of surgery  Before surgery, you can play an important role by reducing the number of germs on your skin.  CHG (Chlorhexidine gluconate) soap is an antiseptic cleanser which kills germs and bonds with the skin to continue killing germs even after washing.  Please do not use if you have an allergy to CHG or antibacterial soaps. If your skin becomes reddened/irritated stop using the CHG.  1. Shower the NIGHT BEFORE SURGERY and the MORNING OF SURGERY with CHG soap.  2. If you choose to wash your hair, wash your hair first as usual with your normal shampoo.  3. After shampooing, rinse your hair and body thoroughly to remove the shampoo.  4. Use CHG as you would any other liquid soap. You can apply CHG directly to the skin and wash gently with a scrungie or a clean washcloth.  5. Apply the CHG soap to your body only from the neck down. Do not use on open wounds or open sores. Avoid contact with your eyes,  ears, mouth, and genitals (private parts). Wash face and genitals (private parts) with your normal soap.  6. Wash thoroughly, paying special attention to the area where your surgery will be performed.  7. Thoroughly rinse your body with warm water.  8. Do not shower/wash with your normal soap after using and rinsing off the CHG soap.  9. Pat yourself dry with a clean towel.  10. Wear clean pajamas to bed the night before surgery.  12. Place clean sheets on your bed the night of your first shower and do not sleep with pets.  13. Shower again with the CHG soap on the day of surgery prior to arriving at the hospital.  14. Do not apply any deodorants/lotions/powders.  15. Please wear clean clothes to the hospital.

## 2023-04-23 NOTE — Progress Notes (Signed)
Perioperative Services Pre-Admission/Anesthesia Testing    Date: 04/23/23  Name: Joseph Frye MRN:   [id number]  Re: Change in surgical plans; needs cardiology evaluation prior to surgery  Planned Surgical Procedure(s):    Case: [id number] Date/Time: 04/29/23 0715   Procedures:      XI ROBOTIC ASSISTED LAPAROSCOPIC CHOLECYSTECTOMY     INDOCYANINE GREEN FLUORESCENCE IMAGING (ICG)   Anesthesia type: General   Pre-op diagnosis: biliary colic   Location: [facility] OR ROOM 07 / [facility]   Surgeons: Leafy Ro, MD      Clinical Notes:  Patient is scheduled for the above procedure on 04/29/2023 with Dr. Sterling Big, MD.  In preparation for his procedure, patient underwent PAT interview on 04/23/2023.  Chart was forwarded to me for preanesthesia review due to recent episodes of chest pain.  Patient was seen in the ED back in 01/2023 for episodes of atypical retrosternal chest pain.  Limited cardiac workup performed including two sets of cardiac enzymes, both of which were negative, and a nonischemic ECG revealing normal sinus rhythm at a rate of 67 bpm.  Given overall stability, patient was discharged home in stable condition with plans for outpatient follow-up with cardiology.  Prescription for short acting nitrate therapy (SL NTG) was provided). Apparently, since this episode, patient experienced further episode of chest pain while at the beach.  Unclear as to whether or not patient was evaluated again. Patient has been reporting increasing episodes of retrosternal, precordial, and epigastric pains that occur during the night. It is unknown to me whether or not he has experienced any associated shortness of breath, PND, orthopnea, palpitations, significant peripheral edema, nausea/vomiting, increased fatigue, vertiginous symptoms, or presyncope/syncope.  Patient underwent ultrasound imaging of his gallbladder on 03/24/2023 revealing no evidence of cholelithiasis or  sonographic evidence of cholecystitis.  Subsequent HIDA scan was performed on 03/24/2023 that revealed no filling of the gallbladder, which was suggestive of potential cystic duct obstruction.  Patient has been seen in consult by general surgery, surgeon feels as if HIDA scan was potentially a false positive due to daily ETOH consumption.  Patient was sent for CT imaging of the abdomen pelvis on 04/01/2023 that demonstrated material within a decompressed gallbladder reflecting potential sludge/cholelithiasis versus retained contrast material. Given imaging results, and persistent symptoms, general surgery felt that it would be reasonable to proceed with robotic assisted cholecystectomy.  Patient's past medical history significant for acute basal ganglia CVA back in 01/2013.  Patient remains on daily antithrombotic therapy using clopidogrel.  Additionally, patient is on daily low-dose ASA. Patient denied other known cardiovascular history.  TTE was performed back when patient had his stroke revealing an EF of 50-55% and grade 1 diastolic dysfunction.  With that said, patient does have multiple risk factors for ASCVD, including:  Age Sex HTN (on ACEi monotherapy) HLD (on rosuvastatin + CoQ10) Hyperuricemia (gout); can result in systemic inflammation (takes colchicine)  Previous LEFT lentiform nucleus (basal ganglia) infarction in 2014 BMI = 33.86 kg/m Daily ETOH consumption Aortic atherosclerosis  Reviewed aforementioned information with anesthesia attending Suzan Slick, MD).  Discussed concerns related to potential underlying cardiovascular diagnoses.  Discussed plan to attempt to expedite a cardiology consult prior to elective surgery.  Given recent episodes of chest pain, coupled with the patient's multiple risk factors, anesthesia is asking for patient be cleared by cardiology service line prior to surgery.  I reached out to cardiology to see if it would be feasible to expedite appointment.  Alternative  appointment  date of 04/30/2023 offered.  Plans discussed with primary attending surgeon Everlene Farrier, MD).  MD advising that while patient reports a good functional status, there are risk factors present, including the fact that patient tends to "minimize his health concerns".  Discussed plans to make efforts to obtain echocardiogram prior to appointment with cardiology in efforts to expedite evaluation and clearance to proceed with surgery.  Impression and Plan:  Joseph Frye is scheduled to undergo surgery soon with Dr. Everlene Farrier, MD. Patient with episodes of nonspecific chest pain, which it sounds like he may be minimizing.  Patient with cardiovascular risk factors concerning for potential underlying cardiovascular disease.  He has had a CVA in the past and is on clopidogrel.  Mild diastolic dysfunction noted on last echocardiogram (01/2013).  After reviewing patient's medical history, concerns discussed with both anesthesia and surgical service lines for interdisciplinary consensus.  Given that procedure is elective in nature, and he does not have overt acute cholecystitis, consultation with cardiology should be pursued prior to proceeding with elective surgery.  Spoke with cardiology practice.  Appointment secured for 04/30/2023 at 10 AM with Dr. Debbe Odea, MD. office unable to accommodate request to have echocardiogram performed in their office prior to seeing cardiologist.  Contacted scheduling to determine if we can have echocardiogram performed through cardiopulmonary services here at [facility].  I had to leave a message on machine for scheduler with request; will follow-up on availability.    Attempted to contact patient; LMOM. Patient was sent details regarding need to postpone surgery pending consultation with cardiology, rescheduling of his cardiology appointment from 05/24/2023 to 04/30/2023 (next Friday), and attempts to have echocardiogram performed prior to  him being seen by the cardiologist.  Patient was made aware that there is potential that cardiovascular testing cannot be performed prior to him seeing cardiologist.  Message advised that someone from scheduling would follow-up with him about availability regarding echocardiogram.  Additionally, patient was advised that surgeons office would follow-up with him regarding rescheduling his surgery following appropriate disposition from cardiology.  Quentin Mulling, MSN, APRN, FNP-C, CEN [facility]  Perioperative Services Nurse Practitioner Phone: [phone] Fax: [phone] 04/23/23 5:52 PM  NOTE: This note has been prepared using Dragon dictation software. Despite my best ability to proofread, there is always the potential that unintentional transcriptional errors may still occur from this process.

## 2023-04-26 ENCOUNTER — Telehealth: Payer: Self-pay | Admitting: Cardiology

## 2023-04-26 ENCOUNTER — Other Ambulatory Visit: Payer: Self-pay | Admitting: Urgent Care

## 2023-04-26 DIAGNOSIS — R0789 Other chest pain: Secondary | ICD-10-CM

## 2023-04-26 NOTE — Progress Notes (Signed)
Request for Cardiology Clearance has been faxed to Dr Azucena Cecil at [facility].

## 2023-04-26 NOTE — Telephone Encounter (Signed)
   Name: DALESSANDRO SCATENA  DOB: 1950-06-01  MRN: [id number]  Primary Cardiologist: None  Chart reviewed as part of pre-operative protocol coverage. The patient has an upcoming visit scheduled with Dr. Azucena Cecil on 04/30/2023 at which time clearance can be addressed in case there are any issues that would impact surgical recommendations.  Robotic cholecystectomy is not scheduled until 05/11/2023 as below. I added preop FYI to appointment note so that provider is aware to address at time of outpatient visit.  Per office protocol the cardiology provider should forward their finalized clearance decision and recommendations regarding antiplatelet therapy to the requesting party below.    I will route this message as FYI to requesting party and remove this message from the preop box as separate preop APP input not needed at this time.   Please call with any questions.  Joylene Grapes, NP  04/26/2023, 3:15 PM

## 2023-04-26 NOTE — Telephone Encounter (Signed)
   Pre-operative Risk Assessment    Patient Name: JAIQUAN SITES  DOB: 06-Jan-1951 MRN: [id number]   Date of last office visit: n/a Date of next office visit: 04/30/23   Request for Surgical Clearance    Procedure:  Robotic Cholecystectomy  Date of Surgery:  Clearance 05/11/23                                Surgeon:  Dr. Sterling Big Surgeon's Group or Practice Name:  [facility] Phone number:  [phone] Fax number:  [phone]   Type of Clearance Requested:   - Medical , Chest Pain   Type of Anesthesia:  General    Additional requests/questions:    Signed, Narda Amber   04/26/2023, 2:58 PM

## 2023-04-27 ENCOUNTER — Ambulatory Visit: Payer: Medicare Other | Attending: Urgent Care

## 2023-04-27 ENCOUNTER — Telehealth: Payer: Self-pay | Admitting: Surgery

## 2023-04-27 ENCOUNTER — Encounter: Payer: Self-pay | Admitting: Urgent Care

## 2023-04-27 DIAGNOSIS — R0789 Other chest pain: Secondary | ICD-10-CM

## 2023-04-27 LAB — ECHOCARDIOGRAM COMPLETE
AV Mean grad: 5.7 mm[Hg]
AV Peak grad: 11.1 mm[Hg]
Ao pk vel: 1.66 m/s
Area-P 1/2: 2.91 cm2

## 2023-04-27 NOTE — Telephone Encounter (Signed)
Patient has been advised of Pre-Admission date/time, and Surgery date at [facility].  Surgery Date: 05/11/23 Preadmission Testing Date: 05/06/23 (phone 8a-1p)  Patient has been made aware to call [phone], between 1-3:00pm the day before surgery, to find out what time to arrive for surgery.

## 2023-04-30 ENCOUNTER — Encounter: Payer: Self-pay | Admitting: Cardiology

## 2023-04-30 ENCOUNTER — Ambulatory Visit: Payer: Medicare Other | Attending: Cardiology | Admitting: Cardiology

## 2023-04-30 VITALS — BP 152/88 | HR 64 | Ht 70.0 in | Wt 240.6 lb

## 2023-04-30 DIAGNOSIS — I10 Essential (primary) hypertension: Secondary | ICD-10-CM | POA: Insufficient documentation

## 2023-04-30 DIAGNOSIS — R079 Chest pain, unspecified: Secondary | ICD-10-CM | POA: Diagnosis present

## 2023-04-30 DIAGNOSIS — Z01810 Encounter for preprocedural cardiovascular examination: Secondary | ICD-10-CM | POA: Insufficient documentation

## 2023-04-30 NOTE — Progress Notes (Signed)
Cardiology Office Note:    Date:  04/30/2023   ID:  Joseph Frye, Joseph Frye Sep 30, 1950, MRN [id number]  PCP:  Jaclyn Shaggy, MD   [facility] Providers Cardiologist:  Debbe Odea, MD     Referring MD: Delton Prairie, MD   Chief Complaint  Patient presents with   New Patient (Initial Visit)    Referred for cardiac evaluation of chest pain needing PREOP CLEARANCE for cholecystectomy.  Continued intermittent mid chest pain .      History of Present Illness:    Joseph Frye is a 73 y.o. male with a hx of hypertension, hyperlipidemia, CVA 2014 presenting for preop evaluation.  Patient has cholecystitis, cholecystectomy is being planned in about 10 days.  Has occasional nonexertional chest pain last occurrence yesterday.  Endorse having a stroke in 2014, started on aspirin and Plavix which she has been taking since.  Blood pressure at home is usually controlled with systolics in the 120s to 140s, has not taken BP meds today.  Echocardiogram 04/2023 EF 50 to 55%, impaired relaxation, mild aortic valve calcification with no significant stenosis.  Past Medical History:  Diagnosis Date   Acute stroke of basal ganglia (HCC) 01/30/2013   a.) MRI brain 01/31/2013: acute infarct in LEFT lentiform nucleus (basal ganglia)   Aortic atherosclerosis (HCC)    Atypical chest pain    Bilateral inguinal hernia    Daily consumption of alcohol    a.) Miller light (beer) x 5/day   DDD (degenerative disc disease), lumbar    Diastolic dysfunction 01/31/2013   a.) TTE 01/31/2013: EF 50-55%, no RWMAs, G1DD, mild LAE, norm RVSF, RVSP 27.2 mmHg, AoV sclerosis without stenosis, triv PR; b.) TTE 04/27/2023: EF 50-555%, no RWMAs, G1dd, GLS -10.8%, mild MAE, mild AoV calc, asc Ao borderline dilated at 39 mm   Diverticulosis    Enlarged prostate    Gout    Hepatic steatosis    Hiatal hernia    History of kidney stones    HTN (hypertension)    Long-term use of aspirin therapy    LSC (lichen  simplex chronicus) 2013   Malignant melanoma of skin of back (HCC) 01/2010   a.) pT1a pNx pMx; excised from back   On long term clopidogrel therapy    Pulmonary nodules    Squamous cell skin cancer 2014    Past Surgical History:  Procedure Laterality Date   MELANOMA EXCISION      Current Medications: Current Meds  Medication Sig   aspirin EC 81 MG tablet Take 81 mg by mouth daily. Swallow whole.   clopidogrel (PLAVIX) 75 MG tablet Take 75 mg by mouth daily.   Coenzyme Q10 (CO Q10 PO) Take 1 capsule by mouth daily.   colchicine 0.6 MG tablet Take 0.6 mg by mouth every other day.   lisinopril (ZESTRIL) 10 MG tablet Take 10 mg by mouth in the morning and at bedtime.   meloxicam (MOBIC) 15 MG tablet Take 15 mg by mouth daily as needed for pain.   Multiple Vitamins-Minerals (CENTRUM SILVER 50+MEN PO) Take 1 tablet by mouth daily.   nitroGLYCERIN (NITROSTAT) 0.4 MG SL tablet Place 0.4 mg under the tongue every 5 (five) minutes as needed for chest pain.   rosuvastatin (CRESTOR) 5 MG tablet Take 5 mg by mouth every other day.     Allergies:   Patient has no known allergies.   Social History   Socioeconomic History   Marital status: Married    Spouse  name: Not on file   Number of children: Not on file   Years of education: Not on file   Highest education level: Not on file  Occupational History   Not on file  Tobacco Use   Smoking status: Never    Passive exposure: Never   Smokeless tobacco: Never  Vaping Use   Vaping status: Never Used  Substance and Sexual Activity   Alcohol use: Yes    Comment: daily ( 3 beers a day)   Drug use: Not on file   Sexual activity: Not on file  Other Topics Concern   Not on file  Social History Narrative   Not on file   Social Drivers of Health   Financial Resource Strain: Not on file  Food Insecurity: Not on file  Transportation Needs: Not on file  Physical Activity: Not on file  Stress: Not on file  Social Connections: Not on file      Family History: The patient's family history includes AAA (abdominal aortic aneurysm) in his father; Breast cancer in his maternal grandmother and mother; Cervical cancer in his sister; Heart attack in his maternal grandfather.  ROS:   Please see the history of present illness.     All other systems reviewed and are negative.  EKGs/Labs/Other Studies Reviewed:    The following studies were reviewed today:  EKG Interpretation Date/Time:  Friday April 30 2023 10:52:26 EST Ventricular Rate:  64 PR Interval:  198 QRS Duration:  92 QT Interval:  424 QTC Calculation: 437 R Axis:   43  Text Interpretation: Normal sinus rhythm Normal ECG Confirmed by Debbe Odea ([id number]) on 04/30/2023 11:01:23 AM    Recent Labs: 03/26/2023: ALT 18; BUN 18; Creatinine, Ser 0.92; Hemoglobin 15.7; Platelets 261; Potassium 3.9; Sodium 137  Recent Lipid Panel    Component Value Date/Time   CHOL 154 01/31/2013 0502   TRIG 117 01/31/2013 0502   HDL 45 01/31/2013 0502   VLDL 23 01/31/2013 0502   LDLCALC 86 01/31/2013 0502     Risk Assessment/Calculations:            Physical Exam:    VS:  BP (!) 152/88 (BP Location: Left Arm, Patient Position: Sitting, Cuff Size: Large)   Pulse 64   Ht 5\' 10"  (1.778 m)   Wt 240 lb 9.6 oz (109.1 kg)   SpO2 97%   BMI 34.52 kg/m     Wt Readings from Last 3 Encounters:  04/30/23 240 lb 9.6 oz (109.1 kg)  04/19/23 236 lb (107 kg)  03/11/23 239 lb (108.4 kg)     GEN:  Well nourished, well developed in no acute distress HEENT: Normal NECK: No JVD; No carotid bruits CARDIAC: RRR, no murmurs, rubs, gallops RESPIRATORY:  Clear to auscultation without rales, wheezing or rhonchi  ABDOMEN: Soft, non-tender, non-distended MUSCULOSKELETAL:  No edema; No deformity  SKIN: Warm and dry NEUROLOGIC:  Alert and oriented x 3 PSYCHIATRIC:  Normal affect   ASSESSMENT:    1. Chest pain, unspecified type   2. Pre-procedural cardiovascular examination   3.  Primary hypertension    PLAN:    In order of problems listed above:  Chest pain, several cardiac risk factors.  Echo with normal EF.  Obtain Lexiscan Myoview. Preprocedural exam, cholecystectomy being planned.  EF normal.  Chest pain, evaluate with Myoview as above.  If Myoview shows no significant abnormalities, okay to proceed with surgical procedure. Hypertension, BP elevated, usually controlled at home.  Continue lisinopril 10 mg daily.  Follow-up after cardiac testing     Informed Consent   Shared Decision Making/Informed Consent The risks [chest pain, shortness of breath, cardiac arrhythmias, dizziness, blood pressure fluctuations, myocardial infarction, stroke/transient ischemic attack, nausea, vomiting, allergic reaction, radiation exposure, metallic taste sensation and life-threatening complications (estimated to be 1 in 10,000)], benefits (risk stratification, diagnosing coronary artery disease, treatment guidance) and alternatives of a nuclear stress test were discussed in detail with Mr. Arch and he agrees to proceed.       Medication Adjustments/Labs and Tests Ordered: Current medicines are reviewed at length with the patient today.  Concerns regarding medicines are outlined above.  Orders Placed This Encounter  Procedures   NM Myocar Multi W/Spect W/Wall Motion / EF   EKG 12-Lead   No orders of the defined types were placed in this encounter.   Patient Instructions  Medication Instructions:   Your physician recommends that you continue on your current medications as directed. Please refer to the Current Medication list given to you today.  *If you need a refill on your cardiac medications before your next appointment, please call your pharmacy*   Lab Work:  None Ordered  If you have labs (blood work) drawn today and your tests are completely normal, you will receive your results only by: MyChart Message (if you have MyChart) OR A paper copy in the mail If  you have any lab test that is abnormal or we need to change your treatment, we will call you to review the results.   Testing/Procedures:  [facility] MYOVIEW -- NEXT WEEK  Your Provider has ordered a Stress Test with nuclear imaging. The purpose of this test is to evaluate the blood supply to your heart muscle. This procedure is referred to as a "Non-Invasive Stress Test." This is because other than having an IV started in your vein, nothing is inserted or "invades" your body. Cardiac stress tests are done to find areas of poor blood flow to the heart by determining the extent of coronary artery disease (CAD). Some patients exercise on a treadmill, which naturally increases the blood flow to your heart, while others who are unable to walk on a treadmill due to physical limitations have a pharmacologic/chemical stress agent called Lexiscan. This medicine will mimic walking on a treadmill by temporarily increasing your coronary blood flow.     REPORT TO [facility] MEDICAL MALL ENTRANCE  **Proceed to the 1st desk on the right, REGISTRATION, to check in**  Please note: this test may take anywhere between 2-4 hours to complete    Instructions regarding medication:   _XXX__:   You may take all of your regular morning medications the day of your test unless listed below.  How to prepare for your Myoview test:  Do not eat or drink for 6 hours prior to the test No caffeine for 24 hours prior to the test No smoking 24 hours prior to the test. Ladies, please do not wear dresses.  Skirts or pants are appropriate. Please wear a short sleeve shirt. No perfume, cologne or lotion. Wear comfortable walking shoes. No heels!   PLEASE NOTIFY THE OFFICE AT LEAST 24 HOURS IN ADVANCE IF YOU ARE UNABLE TO KEEP YOUR APPOINTMENT.  [phone] AND  PLEASE NOTIFY NUCLEAR MEDICINE AT [facility] AT LEAST 24 HOURS IN ADVANCE IF YOU ARE UNABLE TO KEEP YOUR APPOINTMENT. [phone]    Follow-Up: At [facility], you  and your health needs are our priority.  As part of our continuing mission to provide  you with exceptional heart care, we have created designated Provider Care Teams.  These Care Teams include your primary Cardiologist (physician) and Advanced Practice Providers (APPs -  Physician Assistants and Nurse Practitioners) who all work together to provide you with the care you need, when you need it.  We recommend signing up for the patient portal called "MyChart".  Sign up information is provided on this After Visit Summary.  MyChart is used to connect with patients for Virtual Visits (Telemedicine).  Patients are able to view lab/test results, encounter notes, upcoming appointments, etc.  Non-urgent messages can be sent to your provider as well.   To learn more about what you can do with MyChart, go to [email or web address].    Your next appointment:   1 - 2  month(s)  Provider:   You may see Debbe Odea, MD or one of the following Advanced Practice Providers on your designated Care Team:   Nicolasa Ducking, NP Eula Listen, PA-C Cadence Fransico Michael, PA-C Charlsie Quest, NP Carlos Levering, NP   Signed, Debbe Odea, MD  04/30/2023 11:32 AM    [facility]

## 2023-04-30 NOTE — Patient Instructions (Signed)
Medication Instructions:   Your physician recommends that you continue on your current medications as directed. Please refer to the Current Medication list given to you today.  *If you need a refill on your cardiac medications before your next appointment, please call your pharmacy*   Lab Work:  None Ordered  If you have labs (blood work) drawn today and your tests are completely normal, you will receive your results only by: MyChart Message (if you have MyChart) OR A paper copy in the mail If you have any lab test that is abnormal or we need to change your treatment, we will call you to review the results.   Testing/Procedures:  [facility] MYOVIEW -- NEXT WEEK  Your Provider has ordered a Stress Test with nuclear imaging. The purpose of this test is to evaluate the blood supply to your heart muscle. This procedure is referred to as a "Non-Invasive Stress Test." This is because other than having an IV started in your vein, nothing is inserted or "invades" your body. Cardiac stress tests are done to find areas of poor blood flow to the heart by determining the extent of coronary artery disease (CAD). Some patients exercise on a treadmill, which naturally increases the blood flow to your heart, while others who are unable to walk on a treadmill due to physical limitations have a pharmacologic/chemical stress agent called Lexiscan. This medicine will mimic walking on a treadmill by temporarily increasing your coronary blood flow.     REPORT TO [facility] MEDICAL MALL ENTRANCE  **Proceed to the 1st desk on the right, REGISTRATION, to check in**  Please note: this test may take anywhere between 2-4 hours to complete    Instructions regarding medication:   _XXX__:   You may take all of your regular morning medications the day of your test unless listed below.  How to prepare for your Myoview test:  Do not eat or drink for 6 hours prior to the test No caffeine for 24 hours prior to the test No  smoking 24 hours prior to the test. Ladies, please do not wear dresses.  Skirts or pants are appropriate. Please wear a short sleeve shirt. No perfume, cologne or lotion. Wear comfortable walking shoes. No heels!   PLEASE NOTIFY THE OFFICE AT LEAST 24 HOURS IN ADVANCE IF YOU ARE UNABLE TO KEEP YOUR APPOINTMENT.  [phone] AND  PLEASE NOTIFY NUCLEAR MEDICINE AT [facility] AT LEAST 24 HOURS IN ADVANCE IF YOU ARE UNABLE TO KEEP YOUR APPOINTMENT. [phone]    Follow-Up: At [facility], you and your health needs are our priority.  As part of our continuing mission to provide you with exceptional heart care, we have created designated Provider Care Teams.  These Care Teams include your primary Cardiologist (physician) and Advanced Practice Providers (APPs -  Physician Assistants and Nurse Practitioners) who all work together to provide you with the care you need, when you need it.  We recommend signing up for the patient portal called "MyChart".  Sign up information is provided on this After Visit Summary.  MyChart is used to connect with patients for Virtual Visits (Telemedicine).  Patients are able to view lab/test results, encounter notes, upcoming appointments, etc.  Non-urgent messages can be sent to your provider as well.   To learn more about what you can do with MyChart, go to [email or web address].    Your next appointment:   1 - 2  month(s)  Provider:   You may see Debbe Odea, MD or one of  the following Advanced Practice Providers on your designated Care Team:   Nicolasa Ducking, NP Eula Listen, PA-C Cadence Fransico Michael, PA-C Charlsie Quest, NP Carlos Levering, NP

## 2023-05-06 ENCOUNTER — Encounter
Admission: RE | Admit: 2023-05-06 | Discharge: 2023-05-06 | Disposition: A | Discharge status: Home / Self Care | Payer: Medicare Other | Source: Ambulatory Visit | Attending: Cardiology | Admitting: Cardiology

## 2023-05-06 ENCOUNTER — Inpatient Hospital Stay: Admission: RE | Admit: 2023-05-06 | Payer: Medicare Other | Source: Ambulatory Visit

## 2023-05-06 DIAGNOSIS — R079 Chest pain, unspecified: Secondary | ICD-10-CM | POA: Diagnosis present

## 2023-05-06 LAB — NM MYOCAR MULTI W/SPECT W/WALL MOTION / EF
LV dias vol: 134 mL (ref 62–150)
LV sys vol: 71 mL
Nuc Stress EF: 47 %
Peak HR: 93 {beats}/min
Rest HR: 64 {beats}/min
Rest Nuclear Isotope Dose: 10.3 mCi
SDS: 0
SRS: 2
SSS: 0
ST Depression (mm): 0 mm
Stress Nuclear Isotope Dose: 30.9 mCi
TID: 1.01

## 2023-05-06 MED ORDER — TECHNETIUM TC 99M TETROFOSMIN IV KIT
10.0000 | PACK | Freq: Once | INTRAVENOUS | Status: AC | PRN
  Administered 2023-05-06: 10.25 via INTRAVENOUS

## 2023-05-06 MED ORDER — REGADENOSON 0.4 MG/5ML IV SOLN
0.4000 mg | Freq: Once | INTRAVENOUS | Status: AC
  Administered 2023-05-06: 0.4 mg via INTRAVENOUS

## 2023-05-06 MED ORDER — TECHNETIUM TC 99M TETROFOSMIN IV KIT
30.8700 | PACK | Freq: Once | INTRAVENOUS | Status: AC | PRN
  Administered 2023-05-06: 30.87 via INTRAVENOUS

## 2023-05-10 ENCOUNTER — Encounter
Admission: RE | Admit: 2023-05-10 | Discharge: 2023-05-10 | Disposition: A | Discharge status: Home / Self Care | Payer: Medicare Other | Source: Ambulatory Visit | Attending: Surgery | Admitting: Surgery

## 2023-05-10 ENCOUNTER — Encounter: Payer: Self-pay | Admitting: Surgery

## 2023-05-10 HISTORY — DX: Calculus of bile duct without cholangitis or cholecystitis without obstruction: K80.50

## 2023-05-10 NOTE — Progress Notes (Addendum)
Perioperative / Anesthesia Services  Pre-Admission Testing Clinical Review / Pre-Operative Anesthesia Consult  Date: 05/10/23  Patient Demographics:  Name: Joseph Frye DOB: [birth year] MRN:   [id number]  Planned Surgical Procedure(s):    Case: [id number] Date/Time: 05/11/23 0715   Procedures:      XI ROBOTIC ASSISTED LAPAROSCOPIC CHOLECYSTECTOMY     INDOCYANINE GREEN FLUORESCENCE IMAGING (ICG)   Anesthesia type: General   Pre-op diagnosis: biliary colic   Location: [facility] OR ROOM 07 / [facility]   Surgeons: Leafy Ro, MD      NOTE: Available PAT nursing documentation and vital signs have been reviewed. Clinical nursing staff has updated patient's PMH/PSHx, current medication list, and drug allergies/intolerances to ensure comprehensive history available to assist in medical decision making as it pertains to the aforementioned surgical procedure and anticipated anesthetic course. Extensive review of available clinical information personally performed. [place] [facility]H and PSHx updated with any diagnoses/procedures that  may have been inadvertently omitted during his intake with the pre-admission testing department's nursing staff.  Clinical Discussion:  Joseph Frye is a 73 y.o. male who is submitted for pre-surgical anesthesia review and clearance prior to him undergoing the above procedure. Patient has never been a smoker in the past. Pertinent PMH includes: CAD, diastolic dysfunction, CVA, aortic atherosclerosis, angina, HTN, HLD, hiatal hernia, BILATERAL inguinal hernia, BPH, lumbar DDD.  Patient previously scheduled for surgery, however due to episodes of atypical chest pain.  He was referred to cardiology for preoperative workup prior to elective cholecystectomy. TTE was obtained by PAT provider in preparation for visit with cardiology.   Patient was seen in consult by Dr. Debbe Odea, MD on 04/30/2023; notes reviewed. At the time of his clinic visit,  patient complaining of mild non-exertional shortness of breath and episodes of non-specific chest pain. Patient denied any PND, orthopnea, palpitations, significant peripheral edema, weakness, fatigue, vertiginous symptoms, or presyncope/syncope. Patient with a past medical history significant for cardiovascular diagnoses. Documented physical exam was grossly benign, providing no evidence of acute exacerbation and/or decompensation of the patient's known cardiovascular conditions.  Patient suffered an acute infarct in the LEFT lentiform nucleus (basal ganglia) on 01/30/2013.  Since that time, patient has been on DAPT therapy using ASA + clopidogrel.  Patient reports no significant deficits following neurological event.  TTE that was performed on 04/27/2023 revealed a low normal left ventricular systolic function with an EF of 50-55%.  There were no regional wall motion abnormalities. Left ventricular diastolic Doppler parameters consistent with abnormal relaxation (G1DD).  Average left ventricular GLS -10.8%.  Right ventricular size and function normal.  Left atrium was mildly dilated.  There was mild calcification/sclerosis of the aortic valve.  All transvalvular gradients were noted to be normal providing no evidence suggestive of valvular stenosis.  There was borderline dilatation of the ascending aorta measuring 39 mm.  Again, patient remains on daily DAPT therapy.  He is reportedly compliant with therapy with no evidence reports of GI/GU related bleeding.  Blood pressure elevated at 152/88 on currently prescribed ACEi (lisinopril) monotherapy.  Patient has a supply of short acting nitrates (NTG) to use on a as needed basis for recurrent angina/anginal equivalent symptoms; denied recent use.  Patient is on rosuvastatin for his HLD diagnosis and ASCVD prevention.  He is not diabetic.  Patient is able to complete all of his ADLs/IADLs independently without significant cardiovascular limitation.  He does  experience episodes of shortness of breath and chest pain.  With that  said, patient is felt to be able to achieve at least 4 METS of physical activity without experiencing any significant degrees of angina/anginal equivalent symptoms.  Given upcoming surgical intervention, further evaluation of patient's shortness of breath and chest pain were deemed to be necessary.  Patient was scheduled for outpatient testing.  He will follow-up with cardiology following testing for ongoing care and management.  Since patient was seen by cardiology, he has undergone the recommended noninvasive cardiovascular testing that was recommended by Dr. Azucena Cecil, MD.   Myocardial perfusion imaging study was performed on 05/06/2023 revealing a mildly reduced left ventricular systolic function with an EF of 46%.  There is a small degree of GI related uptake artifact noted.  No evidence of stress-induced myocardial ischemia or arrhythmia; no scintigraphic evidence of scar.  CT attenuation correction images showed mild aortic atherosclerosis and mild coronary artery calcifications.  Overall, study was determined to be low risk.  Joseph Frye is scheduled for an elective XI ROBOTIC ASSISTED LAPAROSCOPIC CHOLECYSTECTOMY on 05/11/2023 with Dr. Sterling Big, MD. Given patient's past medical history significant for cardiovascular diagnoses, presurgical cardiac clearance was sought by the PAT team. Per cardiology, "stress test was low risk, no significant ischemia.  Okay to proceed with surgical procedure from a cardiac perspective".  In review of the patient's chart, it is noted that he is on daily oral antithrombotic therapy. He has been instructed on recommendations for holding his clopidogrel for 5 days prior to his procedure.  During his PAT follow-up visit, it was determined that patient had been off of both his daily low-dose ASA and clopidogrel since 04/21/2023 citing that he was not advised to restart these medications  following his surgery being previously postponed.  Patient denies previous perioperative complications with anesthesia in the past. In review his EMR, there are no records available for review pertaining to any anesthetic courses within the [facility] system in the recent past.      04/30/2023   10:48 AM 04/19/2023   10:44 AM 03/26/2023   11:25 AM  Vitals with BMI  Height 5\' 10"  5\' 10"  5\' 10"   Weight 240 lbs 10 oz 236 lbs   BMI 34.52 33.86   Systolic 152 153 540  Diastolic 88 79 80  Pulse 64 76 59   Providers/Specialists:  NOTE: Primary physician provider listed below. Patient may have been seen by APP or partner within same practice.   PROVIDER ROLE / SPECIALTY LAST Cherly Hensen, MD General Surgery (Surgeon) 04/19/2023  Jaclyn Shaggy, MD Primary Care Provider ???  Debbe Odea, MD Cardiology 04/30/2023   Allergies:  No Known Allergies Current Home Medications:   No current facility-administered medications for this encounter.    aspirin EC 81 MG tablet   clopidogrel (PLAVIX) 75 MG tablet   Coenzyme Q10 (CO Q10 PO)   colchicine 0.6 MG tablet   lisinopril (ZESTRIL) 10 MG tablet   meloxicam (MOBIC) 15 MG tablet   Multiple Vitamins-Minerals (CENTRUM SILVER 50+MEN PO)   nitroGLYCERIN (NITROSTAT) 0.4 MG SL tablet   rosuvastatin (CRESTOR) 5 MG tablet   History:   Past Medical History:  Diagnosis Date   Acute stroke of basal ganglia (HCC) 01/30/2013   a.) MRI brain 01/31/2013: acute infarct in LEFT lentiform nucleus (basal ganglia)   Aortic atherosclerosis (HCC)    Atypical chest pain    Bilateral inguinal hernia    Biliary colic    CAD (coronary artery disease)    a.) MV 05/06/2023: no  ischemia/infarct/scar   Daily consumption of alcohol    a.) Miller light (beer) x 5/day   DDD (degenerative disc disease), lumbar    Diastolic dysfunction 01/31/2013   a.) TTE 01/31/2013: EF 50-55%, no RWMAs, G1DD, mild LAE, norm RVSF, RVSP 27.2 mmHg, AoV sclerosis without  stenosis, triv PR; b.) TTE 04/27/2023: EF 50-55%, no RWMAs, G1DD, GLS -10.8%, mild MAE, mild AoV calc with no stenosis, asc Ao borderline dilated at 39 mm   Diverticulosis    Enlarged prostate    Gout    Hepatic steatosis    Hiatal hernia    History of kidney stones    HLD (hyperlipidemia)    HTN (hypertension)    Long-term use of aspirin therapy    LSC (lichen simplex chronicus) 2013   Malignant melanoma of skin of back (HCC) 01/2010   a.) pT1a pNx pMx; excised from back   On long term clopidogrel therapy    Pulmonary nodules    Squamous cell skin cancer 2014   Past Surgical History:  Procedure Laterality Date   MELANOMA EXCISION     Family History  Problem Relation Age of Onset   Breast cancer Mother    AAA (abdominal aortic aneurysm) Father    Cervical cancer Sister    Breast cancer Maternal Grandmother    Heart attack Maternal Grandfather    Social History   Tobacco Use   Smoking status: Never    Passive exposure: Never   Smokeless tobacco: Never  Substance Use Topics   Alcohol use: Yes    Comment: daily ( 3 beers a day)   Pertinent Clinical Results:  LABS:  Lab Results  Component Value Date   WBC 6.3 03/26/2023   HGB 15.7 03/26/2023   HCT 46.7 03/26/2023   MCV 88.4 03/26/2023   PLT 261 03/26/2023   Lab Results  Component Value Date   NA 137 03/26/2023   CL 102 03/26/2023   K 3.9 03/26/2023   CO2 25 03/26/2023   BUN 18 03/26/2023   CREATININE 0.92 03/26/2023   GFRNONAA >60 03/26/2023   CALCIUM 8.8 (L) 03/26/2023   ALBUMIN 4.4 03/26/2023   GLUCOSE 114 (H) 03/26/2023    ECG: Date: 04/30/2023  Time ECG obtained: 1052 PM Rate: 64 bpm Rhythm: normal sinus Axis (leads I and aVF): normal Intervals: PR 198 ms. QRS 92 ms. QTc 437 ms. ST segment and T wave changes: No evidence of acute T wave abnormalities or significant ST segment elevation or depression.  Comparison: Similar to previous tracing obtained on 01/23/2023   IMAGING /  PROCEDURES: MYOCARDIAL PERFUSION IMAGING STUDY (LEXISCAN) performed on 05/06/2023 Pharmacological myocardial perfusion imaging study with no significant  ischemia Normal wall motion, EF estimated at 46%, small degree of GI uptake artifact No EKG changes concerning for ischemia at peak stress or in recovery. CT attenuation correction images with mild aortic atherosclerosis, mild coronary calcification Low risk scan  TRANSTHORACIC ECHOCARDIOGRAM performed on 04/27/2023 Normal left ventricular systolic function with an EF of 50-55% Left ventricular diastolic Doppler parameters consistent with abnormal relaxation (G1DD). LV GLS + -10.8% Mild LAE Normal right ventricular systolic function Mild aortic calcification Normal transvalvular gradients; no valvular stenosis Ascending aorta borderline dilated at 39 mm No pericardial effusion  CT ABDOMEN PELVIS W CONTRAST performed on 04/01/2023 No acute abnormality in the abdomen or pelvis. Tiny hiatal hernia. Colonic diverticulosis without findings of acute diverticulitis. Hyperdense material within a decompressed gallbladder may reflect sludge/stones versus vicarious secretion of contrast. Scattered pulmonary nodules in  the lung base measure up to 8 mm in the right middle lobe suggest follow-up dedicated chest CT in 3-6 months. 13 mm right adrenal nodule, statistically likely to reflect a benign adrenal adenoma. Consider further evaluation with adrenal protocol CT in 1 year. Moderate right and small left fat containing inguinal hernias. Aortic atherosclerosis   NM HEPATO W/EJECT FRACT performed on 03/24/2023 Nonvisualization of the gallbladder consistent with cystic duct obstruction due to acute cholecystitis. Normal biliary to bowel transit compatible with patent common bile duct These results will be called to the ordering clinician or representative by the Radiologist Assistant, and communication documented in the PACS or [facility]  Dashboard  US ABDOMEN LIMITED RUQ (LIVER/GB) performed on 03/24/2023 Increased hepatic parenchymal echogenicity suggestive of steatosis. No cholelithiasis or sonographic evidence for acute cholecystitis.  DG CHEST 2 VIEW performed on 01/23/2023 Cardiac enlargement.  No vascular congestion, edema, or consolidation.  No pleural effusions.  No pneumothorax.  Mediastinal contours appear intact. No evidence of active pulmonary disease.   Impression and Plan:  KALUM MINNER has been referred for pre-anesthesia review and clearance prior to him undergoing the planned anesthetic and procedural courses. Available labs, pertinent testing, and imaging results were personally reviewed by me in preparation for upcoming operative/procedural course. [facility] medical record has been updated following extensive record review and patient interview with PAT staff.   This patient has been appropriately cleared by cardiology with an overall ACCEPTABLE risk of experiencing significant perioperative cardiovascular complications. Based on clinical review performed today (05/10/23), barring any significant acute changes in the patient's overall condition, it is anticipated that he will be able to proceed with the planned surgical intervention. Any acute changes in clinical condition may necessitate his procedure being postponed and/or cancelled. Patient will meet with anesthesia team (MD and/or CRNA) on the day of his procedure for preoperative evaluation/assessment. Questions regarding anesthetic course will be fielded at that time.   Pre-surgical instructions were reviewed with the patient during his PAT appointment, and questions were fielded to satisfaction by PAT clinical staff. He has been instructed on which medications that he will need to hold prior to surgery, as well as the ones that have been deemed safe/appropriate to take on the day of his procedure. As part of the general education provided by PAT,  patient made aware both verbally and in writing, that he would need to abstain from the use of any illegal substances during his perioperative course. He was advised that failure to follow the provided instructions could necessitate case cancellation or result in serious perioperative complications up to and including death. Patient encouraged to contact PAT and/or his surgeon's office to discuss any questions or concerns that may arise prior to surgery; verbalized understanding.   Joseph Mulling, Joseph Frye, Joseph Frye, Joseph Frye, Joseph Frye [facility]  Perioperative Services Nurse Practitioner Phone: [phone] Fax: [phone] 05/10/23 10:37 AM  NOTE: This note has been prepared using Dragon dictation software. Despite my best ability to proofread, there is always the potential that unintentional transcriptional errors may still occur from this process.

## 2023-05-10 NOTE — Patient Instructions (Addendum)
Your procedure is scheduled on:05-11-23 Tuesday Report to the Registration Desk on the 1st floor of the Medical [facility].Then proceed to the 2nd floor Surgery Desk To find out your arrival time, please call [phone] between 1PM - 3PM on:05-10-23 Monday If your arrival time is 6:00 am, do not arrive before that time as the [facility] entrance doors do not open until 6:00 am.  REMEMBER: Instructions that are not followed completely may result in serious medical risk, up to and including death; or upon the discretion of your surgeon and anesthesiologist your surgery may need to be rescheduled.  Do not eat food after midnight the night before surgery.  No gum chewing or hard candies.  You may however, drink CLEAR liquids up to 2 hours before you are scheduled to arrive for your surgery. Do not drink anything within 2 hours of your scheduled arrival time.  Clear liquids include: - water  - apple juice without pulp - gatorade (not RED colors) - black coffee or tea (Do NOT add milk or creamers to the coffee or tea) Do NOT drink anything that is not on this list.  One week prior to surgery:Stop NOW (05-10-23) Stop Anti-inflammatories (NSAIDS) such as meloxicam (MOBIC), Advil, Aleve, Ibuprofen, Motrin, Naproxen, Naprosyn and Aspirin based products such as Excedrin, Goody's Powder, BC Powder. Stop ANY OVER THE COUNTER supplements until after surgery (Co Q10, Multivitamin)  You may however, continue to take Tylenol if needed for pain up until the day of surgery.  clopidogrel (PLAVIX) and Aspirin have been stopped since 04-21-23  Continue taking all of your other prescription medications up until the day of surgery.  Do NOT take any medication the day of surgery  No Alcohol for 24 hours before or after surgery.  No Smoking including e-cigarettes for 24 hours before surgery.  No chewable tobacco products for at least 6 hours before surgery.  No nicotine patches on the day of surgery.  Do not  use any "recreational" drugs for at least a week (preferably 2 weeks) before your surgery.  Please be advised that the combination of cocaine and anesthesia may have negative outcomes, up to and including death. If you test positive for cocaine, your surgery will be cancelled.  On the morning of surgery brush your teeth with toothpaste and water, you may rinse your mouth with mouthwash if you wish. Do not swallow any toothpaste or mouthwash.  Use CHG Soap as directed on instruction sheet.  Do not wear jewelry, make-up, hairpins, clips or nail polish.  For welded (permanent) jewelry: bracelets, anklets, waist bands, etc.  Please have this removed prior to surgery.  If it is not removed, there is a chance that hospital personnel will need to cut it off on the day of surgery.  Do not wear lotions, powders, or perfumes.   Do not shave body hair from the neck down 48 hours before surgery.  Contact lenses, hearing aids and dentures may not be worn into surgery.  Do not bring valuables to the hospital. [facility] is not responsible for any missing/lost belongings or valuables.   Notify your doctor if there is any change in your medical condition (cold, fever, infection).  Wear comfortable clothing (specific to your surgery type) to the hospital.  After surgery, you can help prevent lung complications by doing breathing exercises.  Take deep breaths and cough every 1-2 hours. Your doctor may order a device called an Incentive Spirometer to help you take deep breaths. When coughing or  sneezing, hold a pillow firmly against your incision with both hands. This is called "splinting." Doing this helps protect your incision. It also decreases belly discomfort.  If you are being admitted to the hospital overnight, leave your suitcase in the car. After surgery it may be brought to your room.  In case of increased patient census, it may be necessary for you, the patient, to continue your postoperative  care in the Same Day Surgery department.  If you are being discharged the day of surgery, you will not be allowed to drive home. You will need a responsible individual to drive you home and stay with you for 24 hours after surgery.   If you are taking public transportation, you will need to have a responsible individual with you.  Please call the Pre-admissions Testing Dept. at [phone] if you have any questions about these instructions.  Surgery Visitation Policy:  Patients having surgery or a procedure may have two visitors.  Children under the age of 78 must have an adult with them who is not the patient.  Temporary Visitor Restrictions Due to increasing cases of flu, RSV and COVID-19: Children ages 46 and under will not be able to visit patients in [facility] hospitals under most circumstances.     Preparing for Surgery with CHLORHEXIDINE GLUCONATE (CHG) Soap  Chlorhexidine Gluconate (CHG) Soap  o An antiseptic cleaner that kills germs and bonds with the skin to continue killing germs even after washing  o Used for showering the night before surgery and morning of surgery  Before surgery, you can play an important role by reducing the number of germs on your skin.  CHG (Chlorhexidine gluconate) soap is an antiseptic cleanser which kills germs and bonds with the skin to continue killing germs even after washing.  Please do not use if you have an allergy to CHG or antibacterial soaps. If your skin becomes reddened/irritated stop using the CHG.  1. Shower the NIGHT BEFORE SURGERY and the MORNING OF SURGERY with CHG soap.  2. If you choose to wash your hair, wash your hair first as usual with your normal shampoo.  3. After shampooing, rinse your hair and body thoroughly to remove the shampoo.  4. Use CHG as you would any other liquid soap. You can apply CHG directly to the skin and wash gently with a scrungie or a clean washcloth.  5. Apply the CHG soap to your body  only from the neck down. Do not use on open wounds or open sores. Avoid contact with your eyes, ears, mouth, and genitals (private parts). Wash face and genitals (private parts) with your normal soap.  6. Wash thoroughly, paying special attention to the area where your surgery will be performed.  7. Thoroughly rinse your body with warm water.  8. Do not shower/wash with your normal soap after using and rinsing off the CHG soap.  9. Pat yourself dry with a clean towel.  10. Wear clean pajamas to bed the night before surgery.  12. Place clean sheets on your bed the night of your first shower and do not sleep with pets.  13. Shower again with the CHG soap on the day of surgery prior to arriving at the hospital.  14. Do not apply any deodorants/lotions/powders.  15. Please wear clean clothes to the hospital.

## 2023-05-11 ENCOUNTER — Encounter
Admission: RE | Disposition: A | Discharge status: Home / Self Care | Payer: Self-pay | Source: Home / Self Care | Attending: Surgery

## 2023-05-11 ENCOUNTER — Other Ambulatory Visit: Payer: Self-pay

## 2023-05-11 ENCOUNTER — Ambulatory Visit: Payer: Medicare Other | Admitting: Urgent Care

## 2023-05-11 ENCOUNTER — Ambulatory Visit
Admission: RE | Admit: 2023-05-11 | Discharge: 2023-05-11 | Disposition: A | Discharge status: Home / Self Care | Payer: Medicare Other | Attending: Surgery | Admitting: Surgery

## 2023-05-11 ENCOUNTER — Encounter: Payer: Self-pay | Admitting: Surgery

## 2023-05-11 DIAGNOSIS — I2510 Atherosclerotic heart disease of native coronary artery without angina pectoris: Secondary | ICD-10-CM | POA: Diagnosis not present

## 2023-05-11 DIAGNOSIS — K811 Chronic cholecystitis: Secondary | ICD-10-CM | POA: Diagnosis not present

## 2023-05-11 DIAGNOSIS — I25119 Atherosclerotic heart disease of native coronary artery with unspecified angina pectoris: Secondary | ICD-10-CM | POA: Insufficient documentation

## 2023-05-11 DIAGNOSIS — I700 Atherosclerosis of aorta: Secondary | ICD-10-CM | POA: Insufficient documentation

## 2023-05-11 DIAGNOSIS — Z7902 Long term (current) use of antithrombotics/antiplatelets: Secondary | ICD-10-CM | POA: Insufficient documentation

## 2023-05-11 DIAGNOSIS — I10 Essential (primary) hypertension: Secondary | ICD-10-CM | POA: Diagnosis not present

## 2023-05-11 DIAGNOSIS — Z7982 Long term (current) use of aspirin: Secondary | ICD-10-CM | POA: Insufficient documentation

## 2023-05-11 DIAGNOSIS — Z8673 Personal history of transient ischemic attack (TIA), and cerebral infarction without residual deficits: Secondary | ICD-10-CM | POA: Insufficient documentation

## 2023-05-11 DIAGNOSIS — K8050 Calculus of bile duct without cholangitis or cholecystitis without obstruction: Secondary | ICD-10-CM

## 2023-05-11 DIAGNOSIS — E785 Hyperlipidemia, unspecified: Secondary | ICD-10-CM | POA: Insufficient documentation

## 2023-05-11 DIAGNOSIS — K8010 Calculus of gallbladder with chronic cholecystitis without obstruction: Secondary | ICD-10-CM | POA: Insufficient documentation

## 2023-05-11 HISTORY — DX: Benign prostatic hyperplasia without lower urinary tract symptoms: N40.0

## 2023-05-11 HISTORY — DX: Other intervertebral disc degeneration, lumbar region without mention of lumbar back pain or lower extremity pain: M51.369

## 2023-05-11 HISTORY — PX: CHOLECYSTECTOMY: SHX55

## 2023-05-11 HISTORY — DX: Gout, unspecified: M10.9

## 2023-05-11 HISTORY — DX: Atherosclerosis of aorta: I70.0

## 2023-05-11 HISTORY — DX: Diaphragmatic hernia without obstruction or gangrene: K44.9

## 2023-05-11 HISTORY — DX: Long term (current) use of aspirin: Z79.82

## 2023-05-11 HISTORY — DX: Other nonspecific abnormal finding of lung field: R91.8

## 2023-05-11 HISTORY — DX: Long term (current) use of anticoagulants: Z79.01

## 2023-05-11 HISTORY — DX: Bilateral inguinal hernia, without obstruction or gangrene, not specified as recurrent: K40.20

## 2023-05-11 HISTORY — DX: Other specified health status: Z78.9

## 2023-05-11 HISTORY — DX: Fatty (change of) liver, not elsewhere classified: K76.0

## 2023-05-11 HISTORY — DX: Diverticulosis of intestine, part unspecified, without perforation or abscess without bleeding: K57.90

## 2023-05-11 HISTORY — DX: Atherosclerotic heart disease of native coronary artery without angina pectoris: I25.10

## 2023-05-11 HISTORY — DX: Hyperlipidemia, unspecified: E78.5

## 2023-05-11 SURGERY — CHOLECYSTECTOMY, ROBOT-ASSISTED, LAPAROSCOPIC
Anesthesia: General

## 2023-05-11 MED ORDER — ACETAMINOPHEN 500 MG PO TABS
1000.0000 mg | ORAL_TABLET | ORAL | Status: AC
  Administered 2023-05-11: 1000 mg via ORAL

## 2023-05-11 MED ORDER — LIDOCAINE HCL (CARDIAC) PF 100 MG/5ML IV SOSY
PREFILLED_SYRINGE | INTRAVENOUS | Status: DC | PRN
  Administered 2023-05-11: 80 mg via INTRAVENOUS

## 2023-05-11 MED ORDER — FENTANYL CITRATE (PF) 100 MCG/2ML IJ SOLN
INTRAMUSCULAR | Status: AC
  Filled 2023-05-11: qty 2

## 2023-05-11 MED ORDER — SUGAMMADEX SODIUM 200 MG/2ML IV SOLN
INTRAVENOUS | Status: DC | PRN
  Administered 2023-05-11: 300 mg via INTRAVENOUS

## 2023-05-11 MED ORDER — CHLORHEXIDINE GLUCONATE CLOTH 2 % EX PADS: 6.0000 | MEDICATED_PAD | Freq: Once | CUTANEOUS | Status: DC

## 2023-05-11 MED ORDER — ORAL CARE MOUTH RINSE
15.0000 mL | Freq: Once | OROMUCOSAL | Status: AC
  Administered 2023-05-11: 15 mL via OROMUCOSAL

## 2023-05-11 MED ORDER — BUPIVACAINE-EPINEPHRINE (PF) 0.25% -1:200000 IJ SOLN
INTRAMUSCULAR | Status: AC
  Filled 2023-05-11: qty 30

## 2023-05-11 MED ORDER — CEFAZOLIN SODIUM-DEXTROSE 2-4 GM/100ML-% IV SOLN
2.0000 g | INTRAVENOUS | Status: AC
  Administered 2023-05-11: 2 g via INTRAVENOUS

## 2023-05-11 MED ORDER — CELECOXIB 200 MG PO CAPS
200.0000 mg | ORAL_CAPSULE | ORAL | Status: AC
  Administered 2023-05-11: 200 mg via ORAL

## 2023-05-11 MED ORDER — BUPIVACAINE LIPOSOME 1.3 % IJ SUSP
INTRAMUSCULAR | Status: AC
  Filled 2023-05-11: qty 20

## 2023-05-11 MED ORDER — LIDOCAINE HCL (PF) 2 % IJ SOLN
INTRAMUSCULAR | Status: AC
  Filled 2023-05-11: qty 5

## 2023-05-11 MED ORDER — PROPOFOL 10 MG/ML IV BOLUS
INTRAVENOUS | Status: AC
  Filled 2023-05-11: qty 40

## 2023-05-11 MED ORDER — LACTATED RINGERS IV SOLN
INTRAVENOUS | Status: DC
Start: 2023-05-11 — End: 2023-05-11

## 2023-05-11 MED ORDER — INDOCYANINE GREEN 25 MG IV SOLR
INTRAVENOUS | Status: AC
  Filled 2023-05-11: qty 10

## 2023-05-11 MED ORDER — FENTANYL CITRATE (PF) 100 MCG/2ML IJ SOLN: 25.0000 ug | INTRAMUSCULAR | Status: DC | PRN

## 2023-05-11 MED ORDER — HYDROCODONE-ACETAMINOPHEN 5-325 MG PO TABS: 1.0000 | ORAL_TABLET | Freq: Four times a day (QID) | ORAL | 0 refills | Status: DC | PRN

## 2023-05-11 MED ORDER — GABAPENTIN 300 MG PO CAPS
300.0000 mg | ORAL_CAPSULE | ORAL | Status: AC
  Administered 2023-05-11: 300 mg via ORAL

## 2023-05-11 MED ORDER — LABETALOL HCL 5 MG/ML IV SOLN
INTRAVENOUS | Status: AC
  Filled 2023-05-11: qty 4

## 2023-05-11 MED ORDER — CELECOXIB 200 MG PO CAPS
ORAL_CAPSULE | ORAL | Status: AC
  Filled 2023-05-11: qty 1

## 2023-05-11 MED ORDER — 0.9 % SODIUM CHLORIDE (POUR BTL) OPTIME
TOPICAL | Status: DC | PRN
  Administered 2023-05-11: 500 mL

## 2023-05-11 MED ORDER — EPHEDRINE 5 MG/ML INJ
INTRAVENOUS | Status: AC
  Filled 2023-05-11: qty 5

## 2023-05-11 MED ORDER — ONDANSETRON HCL 4 MG/2ML IJ SOLN
INTRAMUSCULAR | Status: DC | PRN
  Administered 2023-05-11: 4 mg via INTRAVENOUS

## 2023-05-11 MED ORDER — ONDANSETRON HCL 4 MG/2ML IJ SOLN
INTRAMUSCULAR | Status: AC
  Filled 2023-05-11: qty 2

## 2023-05-11 MED ORDER — DEXAMETHASONE SODIUM PHOSPHATE 10 MG/ML IJ SOLN
INTRAMUSCULAR | Status: AC
  Filled 2023-05-11: qty 1

## 2023-05-11 MED ORDER — ACETAMINOPHEN 500 MG PO TABS
ORAL_TABLET | ORAL | Status: AC
  Filled 2023-05-11: qty 2

## 2023-05-11 MED ORDER — INDOCYANINE GREEN 25 MG IV SOLR
1.2500 mg | Freq: Once | INTRAVENOUS | Status: AC
  Administered 2023-05-11: 1.25 mg via INTRAVENOUS

## 2023-05-11 MED ORDER — ROCURONIUM BROMIDE 100 MG/10ML IV SOLN
INTRAVENOUS | Status: DC | PRN
  Administered 2023-05-11: 20 mg via INTRAVENOUS
  Administered 2023-05-11: 60 mg via INTRAVENOUS

## 2023-05-11 MED ORDER — DEXAMETHASONE SODIUM PHOSPHATE 10 MG/ML IJ SOLN
INTRAMUSCULAR | Status: DC | PRN
  Administered 2023-05-11: 10 mg via INTRAVENOUS

## 2023-05-11 MED ORDER — CEFAZOLIN SODIUM-DEXTROSE 2-4 GM/100ML-% IV SOLN
INTRAVENOUS | Status: AC
  Filled 2023-05-11: qty 100

## 2023-05-11 MED ORDER — FENTANYL CITRATE (PF) 100 MCG/2ML IJ SOLN
INTRAMUSCULAR | Status: DC | PRN
  Administered 2023-05-11 (×4): 50 ug via INTRAVENOUS

## 2023-05-11 MED ORDER — LABETALOL HCL 5 MG/ML IV SOLN
INTRAVENOUS | Status: DC | PRN
  Administered 2023-05-11 (×3): 5 mg via INTRAVENOUS

## 2023-05-11 MED ORDER — CHLORHEXIDINE GLUCONATE 0.12 % MT SOLN: 15.0000 mL | Freq: Once | OROMUCOSAL | Status: AC

## 2023-05-11 MED ORDER — BUPIVACAINE-EPINEPHRINE 0.25% -1:200000 IJ SOLN
INTRAMUSCULAR | Status: DC | PRN
  Administered 2023-05-11: 50 mL via INTRAMUSCULAR

## 2023-05-11 MED ORDER — DROPERIDOL 2.5 MG/ML IJ SOLN: 0.6250 mg | Freq: Once | INTRAMUSCULAR | Status: DC | PRN

## 2023-05-11 MED ORDER — DEXMEDETOMIDINE HCL IN NACL 80 MCG/20ML IV SOLN
INTRAVENOUS | Status: DC | PRN
  Administered 2023-05-11: 4 ug via INTRAVENOUS
  Administered 2023-05-11: 8 ug via INTRAVENOUS

## 2023-05-11 MED ORDER — GABAPENTIN 300 MG PO CAPS
ORAL_CAPSULE | ORAL | Status: AC
  Filled 2023-05-11: qty 1

## 2023-05-11 MED ORDER — MIDAZOLAM HCL 2 MG/2ML IJ SOLN
INTRAMUSCULAR | Status: AC
  Filled 2023-05-11: qty 2

## 2023-05-11 MED ORDER — CHLORHEXIDINE GLUCONATE 0.12 % MT SOLN
OROMUCOSAL | Status: AC
  Filled 2023-05-11: qty 15

## 2023-05-11 MED ORDER — PROPOFOL 10 MG/ML IV BOLUS
INTRAVENOUS | Status: DC | PRN
  Administered 2023-05-11: 150 mg via INTRAVENOUS
  Administered 2023-05-11: 50 mg via INTRAVENOUS

## 2023-05-11 MED ORDER — ROCURONIUM BROMIDE 10 MG/ML (PF) SYRINGE
PREFILLED_SYRINGE | INTRAVENOUS | Status: AC
  Filled 2023-05-11: qty 10

## 2023-05-11 SURGICAL SUPPLY — 42 items
CANNULA REDUCER 12-8 DVNC XI (CANNULA) ×1 IMPLANT
CATH REDDICK CHOLANGI 4FR 50CM (CATHETERS) IMPLANT
CAUTERY HOOK MNPLR 1.6 DVNC XI (INSTRUMENTS) ×1 IMPLANT
CLIP LIGATING HEMO O LOK GREEN (MISCELLANEOUS) ×1 IMPLANT
DERMABOND ADVANCED .7 DNX12 (GAUZE/BANDAGES/DRESSINGS) ×1 IMPLANT
DRAPE ARM DVNC X/XI (DISPOSABLE) ×4 IMPLANT
DRAPE COLUMN DVNC XI (DISPOSABLE) ×1 IMPLANT
ELECT REM PT RETURN 9FT ADLT (ELECTROSURGICAL) ×1
ELECTRODE REM PT RTRN 9FT ADLT (ELECTROSURGICAL) ×1 IMPLANT
FORCEPS BPLR R/ABLATION 8 DVNC (INSTRUMENTS) ×1 IMPLANT
FORCEPS PROGRASP DVNC XI (FORCEP) ×1 IMPLANT
GLOVE BIO SURGEON STRL SZ7 (GLOVE) ×2 IMPLANT
GOWN STRL REUS W/ TWL LRG LVL3 (GOWN DISPOSABLE) ×4 IMPLANT
IRRIGATION STRYKERFLOW (MISCELLANEOUS) IMPLANT
IRRIGATOR STRYKERFLOW (MISCELLANEOUS)
IV CATH ANGIO 12GX3 LT BLUE (NEEDLE) IMPLANT
KIT PINK PAD W/HEAD ARE REST (MISCELLANEOUS) ×1
KIT PINK PAD W/HEAD ARM REST (MISCELLANEOUS) ×1 IMPLANT
LABEL OR SOLS (LABEL) ×1 IMPLANT
MANIFOLD NEPTUNE II (INSTRUMENTS) ×1 IMPLANT
NDL HYPO 22X1.5 SAFETY MO (MISCELLANEOUS) ×1 IMPLANT
NEEDLE HYPO 22X1.5 SAFETY MO (MISCELLANEOUS) ×1
NS IRRIG 500ML POUR BTL (IV SOLUTION) ×1 IMPLANT
OBTURATOR OPTICAL STND 8 DVNC (TROCAR) ×1
OBTURATOR OPTICALSTD 8 DVNC (TROCAR) ×1 IMPLANT
PACK LAP CHOLECYSTECTOMY (MISCELLANEOUS) ×1 IMPLANT
SEAL UNIV 5-12 XI (MISCELLANEOUS) ×4 IMPLANT
SET TUBE SMOKE EVAC HIGH FLOW (TUBING) ×1 IMPLANT
SOL ELECTROSURG ANTI STICK (MISCELLANEOUS) ×1
SOLUTION ELECTROSURG ANTI STCK (MISCELLANEOUS) ×1 IMPLANT
SPIKE FLUID TRANSFER (MISCELLANEOUS) ×1 IMPLANT
SPONGE T-LAP 18X18 ~~LOC~~+RFID (SPONGE) ×1 IMPLANT
SPONGE T-LAP 4X18 ~~LOC~~+RFID (SPONGE) IMPLANT
STOPCOCK 3WAY MALE LL (IV SETS) IMPLANT
SUT MNCRL AB 4-0 PS2 18 (SUTURE) ×1 IMPLANT
SUT VICRYL 0 UR6 27IN ABS (SUTURE) ×2 IMPLANT
SYR 20ML LL LF (SYRINGE) IMPLANT
SYS BAG RETRIEVAL 10MM (BASKET) ×1
SYSTEM BAG RETRIEVAL 10MM (BASKET) ×1 IMPLANT
TRAP FLUID SMOKE EVACUATOR (MISCELLANEOUS) ×1 IMPLANT
WATER STERILE IRR 3000ML UROMA (IV SOLUTION) IMPLANT
WATER STERILE IRR 500ML POUR (IV SOLUTION) ×1 IMPLANT

## 2023-05-11 NOTE — Discharge Instructions (Signed)

## 2023-05-11 NOTE — Interval H&P Note (Signed)
 History and Physical Interval Note:  05/11/2023 7:26 AM  Joseph Frye  has presented today for surgery, with the diagnosis of biliary colic.  The various methods of treatment have been discussed with the patient and family. After consideration of risks, benefits and other options for treatment, the patient has consented to  Procedure(s): XI ROBOTIC ASSISTED LAPAROSCOPIC CHOLECYSTECTOMY (N/A) INDOCYANINE GREEN  FLUORESCENCE IMAGING (ICG) (N/A) as a surgical intervention.  The patient's history has been reviewed, patient examined, no change in status, stable for surgery.  I have reviewed the patient's chart and labs.  Questions were answered to the patient's satisfaction.     Joseph Frye F Joseph Frye

## 2023-05-11 NOTE — Anesthesia Postprocedure Evaluation (Signed)
 Anesthesia Post Note  Patient: Joseph Frye  Procedure(s) Performed: XI ROBOTIC ASSISTED LAPAROSCOPIC CHOLECYSTECTOMY INDOCYANINE GREEN  FLUORESCENCE IMAGING (ICG)  Patient location during evaluation: PACU Anesthesia Type: General Level of consciousness: awake and alert Pain management: pain level controlled Vital Signs Assessment: post-procedure vital signs reviewed and stable Respiratory status: spontaneous breathing, nonlabored ventilation, respiratory function stable and patient connected to nasal cannula oxygen Cardiovascular status: blood pressure returned to baseline and stable Postop Assessment: no apparent nausea or vomiting Anesthetic complications: no   No notable events documented.   Last Vitals:  Vitals:   05/11/23 0926 05/11/23 0931  BP:  (!) 158/83  Pulse:    Resp: 18   Temp: 36.8 C   SpO2:  96%    Last Pain:  Vitals:   05/11/23 0926  TempSrc: Temporal  PainSc: 2                  Prentice Murphy

## 2023-05-11 NOTE — Op Note (Signed)
 Robotic assisted laparoscopic Cholecystectomy  Pre-operative Diagnosis: chronic cholecystitis  Post-operative Diagnosis: same  Procedure:  Robotic assisted laparoscopic Cholecystectomy  Surgeon: Laneta Luna, MD FACS  Anesthesia: Gen. with endotracheal tube  Findings: Chronic mild Cholecystitis  Fatty liver  Estimated Blood Loss:5 cc       Specimens: Gallbladder           Complications: none   Procedure Details  The patient was seen again in the Holding Room. The benefits, complications, treatment options, and expected outcomes were discussed with the patient. The risks of bleeding, infection, recurrence of symptoms, failure to resolve symptoms, bile duct damage, bile duct leak, retained common bile duct stone, bowel injury, any of which could require further surgery and/or ERCP, stent, or papillotomy were reviewed with the patient. The likelihood of improving the patient's symptoms with return to their baseline status is good.  The patient and/or family concurred with the proposed plan, giving informed consent.  The patient was taken to Operating Room, identified  and the procedure verified as Laparoscopic Cholecystectomy.  A Time Out was held and the above information confirmed.  Prior to the induction of general anesthesia, antibiotic prophylaxis was administered. VTE prophylaxis was in place. General endotracheal anesthesia was then administered and tolerated well. After the induction, the abdomen was prepped with Chloraprep and draped in the sterile fashion. The patient was positioned in the supine position.  Cut down technique was used to enter the abdominal cavity and a Hasson trochar was placed after two vicryl stitches were anchored to the fascia. Pneumoperitoneum was then created with CO2 and tolerated well without any adverse changes in the patient's vital signs.  Three 8-mm ports were placed under direct vision. All skin incisions  were infiltrated with a local anesthetic  agent before making the incision and placing the trocars.   The patient was positioned  in reverse Trendelenburg, robot was brought to the surgical field and docked in the standard fashion.  We made sure all the instrumentation was kept indirect view at all times and that there were no collision between the arms. I scrubbed out and went to the console. Liver was found to be enlarged w fatty infiltration The gallbladder was identified, the fundus grasped and retracted cephalad. Adhesions were lysed bluntly. The infundibulum was grasped and retracted laterally, exposing the peritoneum overlying the triangle of Calot. This was then divided and exposed in a blunt fashion. An extended critical view of the cystic duct and cystic artery was obtained.  The cystic duct was clearly identified and bluntly dissected.   Artery and duct were double clipped and divided. Using ICG cholangiography we visualize the cystic duct and w/o evidence of bile injuries. The gallbladder was taken from the gallbladder fossa in a retrograde fashion with the electrocautery.  Hemostasis was achieved with the electrocautery. nspection of the right upper quadrant was performed. No bleeding, bile duct injury or leak, or bowel injury was noted. Robotic instruments and robotic arms were undocked in the standard fashion.  I scrubbed back in.  The gallbladder was removed and placed in an Endocatch bag.   Pneumoperitoneum was released.  The periumbilical port site was closed with interrumpted 0 Vicryl sutures. 4-0 subcuticular Monocryl was used to close the skin. Dermabond was  applied.  The patient was then extubated and brought to the recovery room in stable condition. Sponge, lap, and needle counts were correct at closure and at the conclusion of the case.  Laneta Luna, MD, FACS

## 2023-05-11 NOTE — Anesthesia Procedure Notes (Signed)
 Procedure Name: Intubation Date/Time: 05/11/2023 7:42 AM  Performed by: Anice Melnick, CRNAPre-anesthesia Checklist: Patient identified, Patient being monitored, Timeout performed, Emergency Drugs available and Suction available Patient Re-evaluated:Patient Re-evaluated prior to induction Oxygen Delivery Method: Circle system utilized Preoxygenation: Pre-oxygenation with 100% oxygen Induction Type: IV induction Ventilation: Mask ventilation without difficulty and Oral airway inserted - appropriate to patient size Laryngoscope Size: Mac, McGrath and 4 Grade View: Grade I Tube type: Oral Tube size: 7.5 mm Number of attempts: 1 Airway Equipment and Method: Stylet Placement Confirmation: ETT inserted through vocal cords under direct vision, positive ETCO2 and breath sounds checked- equal and bilateral Secured at: 23 cm Tube secured with: Tape Dental Injury: Teeth and Oropharynx as per pre-operative assessment

## 2023-05-11 NOTE — Transfer of Care (Signed)
 Immediate Anesthesia Transfer of Care Note  Patient: Joseph Frye  Procedure(s) Performed: XI ROBOTIC ASSISTED LAPAROSCOPIC CHOLECYSTECTOMY INDOCYANINE GREEN  FLUORESCENCE IMAGING (ICG)  Patient Location: PACU  Anesthesia Type:General  Level of Consciousness: drowsy and patient cooperative  Airway & Oxygen Therapy: Patient Spontanous Breathing and Patient connected to face mask oxygen  Post-op Assessment: Report given to RN and Post -op Vital signs reviewed and stable  Post vital signs: Reviewed and stable  Last Vitals:  Vitals Value Taken Time  BP    Temp    Pulse 70 05/11/23 0853  Resp 16 05/11/23 0853  SpO2 100 % 05/11/23 0853  Vitals shown include unfiled device data.  Last Pain:  Vitals:   05/11/23 0629  TempSrc: Oral  PainSc: 0-No pain         Complications: No notable events documented.

## 2023-05-11 NOTE — Anesthesia Preprocedure Evaluation (Signed)
Anesthesia Evaluation  Patient identified by MRN, date of birth, ID band Patient awake    Reviewed: Allergy & Precautions, H&P , NPO status , Patient's Chart, lab work & pertinent test results, reviewed documented beta blocker date and time   History of Anesthesia Complications Negative for: history of anesthetic complications  Airway Mallampati: III  TM Distance: >3 FB Neck ROM: full    Dental  (+) Dental Advidsory Given, Teeth Intact   Pulmonary neg pulmonary ROS   Pulmonary exam normal breath sounds clear to auscultation       Cardiovascular Exercise Tolerance: Good hypertension, (-) angina + CAD  (-) Past MI and (-) Cardiac Stents Normal cardiovascular exam(-) dysrhythmias (-) Valvular Problems/Murmurs Rhythm:regular Rate:Normal     Neuro/Psych neg Seizures CVA, No Residual Symptoms  negative psych ROS   GI/Hepatic Neg liver ROS, hiatal hernia,neg GERD  ,,  Endo/Other  negative endocrine ROS    Renal/GU negative Renal ROS  negative genitourinary   Musculoskeletal   Abdominal   Peds  Hematology negative hematology ROS (+)   Anesthesia Other Findings Past Medical History: 01/30/2013: Acute stroke of basal ganglia (HCC)     Comment:  a.) MRI brain 01/31/2013: acute infarct in LEFT               lentiform nucleus (basal ganglia) No date: Aortic atherosclerosis (HCC) No date: Atypical chest pain No date: Bilateral inguinal hernia No date: Biliary colic No date: CAD (coronary artery disease)     Comment:  a.) MV 05/06/2023: no ischemia/infarct/scar No date: Daily consumption of alcohol     Comment:  a.) Miller light (beer) x 5/day No date: DDD (degenerative disc disease), lumbar 01/31/2013: Diastolic dysfunction     Comment:  a.) TTE 01/31/2013: EF 50-55%, no RWMAs, G1DD, mild LAE,              norm RVSF, RVSP 27.2 mmHg, AoV sclerosis without               stenosis, triv PR; b.) TTE 04/27/2023: EF 50-55%, no                RWMAs, G1DD, GLS -10.8%, mild MAE, mild AoV calc with no               stenosis, asc Ao borderline dilated at 39 mm No date: Diverticulosis No date: Enlarged prostate No date: Gout No date: Hepatic steatosis No date: Hiatal hernia No date: History of kidney stones No date: HLD (hyperlipidemia) No date: HTN (hypertension) No date: Long-term use of aspirin therapy 2013: LSC (lichen simplex chronicus) 01/2010: Malignant melanoma of skin of back (HCC)     Comment:  a.) pT1a pNx pMx; excised from back No date: On long term clopidogrel therapy No date: Pulmonary nodules 2014: Squamous cell skin cancer   Reproductive/Obstetrics negative OB ROS                             Anesthesia Physical Anesthesia Plan  ASA: 2  Anesthesia Plan: General   Post-op Pain Management:    Induction: Intravenous  PONV Risk Score and Plan: 2 and Ondansetron, Dexamethasone and Treatment may vary due to age or medical condition  Airway Management Planned: Oral ETT  Additional Equipment:   Intra-op Plan:   Post-operative Plan: Extubation in OR  Informed Consent: I have reviewed the patients History and Physical, chart, labs and discussed the procedure including the risks, benefits and alternatives for  the proposed anesthesia with the patient or authorized representative who has indicated his/her understanding and acceptance.     Dental Advisory Given  Plan Discussed with: Anesthesiologist, CRNA and Surgeon  Anesthesia Plan Comments:         Anesthesia Quick Evaluation

## 2023-05-12 LAB — SURGICAL PATHOLOGY

## 2023-05-24 ENCOUNTER — Encounter: Payer: Self-pay | Admitting: Surgery

## 2023-05-24 ENCOUNTER — Ambulatory Visit (INDEPENDENT_AMBULATORY_CARE_PROVIDER_SITE_OTHER): Payer: Medicare Other | Admitting: Surgery

## 2023-05-24 ENCOUNTER — Ambulatory Visit: Payer: Medicare Other | Admitting: Cardiovascular Disease

## 2023-05-24 VITALS — BP 160/83 | HR 60 | Temp 97.7°F | Ht 70.0 in | Wt 233.2 lb

## 2023-05-24 DIAGNOSIS — K811 Chronic cholecystitis: Secondary | ICD-10-CM

## 2023-05-24 DIAGNOSIS — Z09 Encounter for follow-up examination after completed treatment for conditions other than malignant neoplasm: Secondary | ICD-10-CM

## 2023-05-24 NOTE — Patient Instructions (Signed)

## 2023-05-24 NOTE — Progress Notes (Signed)
 Outpatient Surgical Follow Up  05/24/2023  Joseph Frye is an 73 y.o. male.   Chief Complaint  Patient presents with   Routine Post Op    Lap choley 05/11/23    HPI: s/p rob chole for chronic cholecystitis, feels well, no fevers or chills. + PO , ambulating. Path c/w chronic cholecystitis  Past Medical History:  Diagnosis Date   Acute stroke of basal ganglia (HCC) 01/30/2013   a.) MRI brain 01/31/2013: acute infarct in LEFT lentiform nucleus (basal ganglia)   Aortic atherosclerosis (HCC)    Atypical chest pain    Bilateral inguinal hernia    Biliary colic    CAD (coronary artery disease)    a.) MV 05/06/2023: no ischemia/infarct/scar   Daily consumption of alcohol    a.) Miller light (beer) x 5/day   DDD (degenerative disc disease), lumbar    Diastolic dysfunction 01/31/2013   a.) TTE 01/31/2013: EF 50-55%, no RWMAs, G1DD, mild LAE, norm RVSF, RVSP 27.2 mmHg, AoV sclerosis without stenosis, triv PR; b.) TTE 04/27/2023: EF 50-55%, no RWMAs, G1DD, GLS -10.8%, mild MAE, mild AoV calc with no stenosis, asc Ao borderline dilated at 39 mm   Diverticulosis    Enlarged prostate    Gout    Hepatic steatosis    Hiatal hernia    History of kidney stones    HLD (hyperlipidemia)    HTN (hypertension)    Long-term use of aspirin therapy    LSC (lichen simplex chronicus) 2013   Malignant melanoma of skin of back (HCC) 01/2010   a.) pT1a pNx pMx; excised from back   On long term clopidogrel therapy    Pulmonary nodules    Squamous cell skin cancer 2014    Past Surgical History:  Procedure Laterality Date   MELANOMA EXCISION      Family History  Problem Relation Age of Onset   Breast cancer Mother    AAA (abdominal aortic aneurysm) Father    Cervical cancer Sister    Breast cancer Maternal Grandmother    Heart attack Maternal Grandfather     Social History:  reports that he has never smoked. He has never been exposed to tobacco smoke. He has never used smokeless tobacco.  He reports current alcohol use. No history on file for drug use.  Allergies: No Known Allergies  Medications reviewed.    ROS Full ROS performed and is otherwise negative other than what is stated in HPI   BP (!) 160/83   Pulse 60   Temp 97.7 F (36.5 C) (Oral)   Ht 5\' 10"  (1.778 m)   Wt 233 lb 3.2 oz (105.8 kg)   SpO2 99%   BMI 33.46 kg/m   Physical Exam NAd alert Abd: soft, incisions c/d/I, no infection  Assessment/Plan: Doing well w/o complications RTC prn  Sterling Big, MD FACS General Surgeon

## 2023-05-26 ENCOUNTER — Encounter: Payer: Medicare Other | Admitting: Physician Assistant

## 2023-05-31 ENCOUNTER — Encounter: Payer: Self-pay | Admitting: Cardiology

## 2023-05-31 ENCOUNTER — Ambulatory Visit: Payer: Medicare Other | Attending: Cardiology | Admitting: Cardiology

## 2023-05-31 VITALS — BP 160/80 | HR 66 | Ht 70.0 in | Wt 235.0 lb

## 2023-05-31 DIAGNOSIS — I10 Essential (primary) hypertension: Secondary | ICD-10-CM | POA: Insufficient documentation

## 2023-05-31 DIAGNOSIS — I639 Cerebral infarction, unspecified: Secondary | ICD-10-CM | POA: Diagnosis not present

## 2023-05-31 DIAGNOSIS — E782 Mixed hyperlipidemia: Secondary | ICD-10-CM | POA: Diagnosis present

## 2023-05-31 NOTE — Progress Notes (Signed)
 Cardiology Office Note:  .   Date:  05/31/2023  ID:  Stephon, Weathers 1950-04-08, MRN [id number] PCP: Jaclyn Shaggy, MD  [facility] Providers Cardiologist:  Debbe Odea, MD    History of Present Illness: .   MATTIA LIFORD is a 73 y.o. male with a past medical history of hypertension, hyperlipidemia, CVA (2014), who presents today for follow-up.  He was last seen in clinic 04/30/2023 after needing preoperative clearance for cholecystectomy was evaluated by Dr.Agbor-Etang.  At that time he stated he had an occasional nonexertional chest pain.  Pain started on aspirin in 2014 and Plavix after having a stroke.  Blood pressures had been fairly well-controlled.  Echocardiogram completed in 04/2023 revealed an LVEF of 50-55%, impaired relaxation, mild aortic valve calcification but no significant stenosis.  He was scheduled for a Lexiscan Myoview prior to cardiac clearance.  His Myoview was considered low risk scan.  He did undergo robotic assisted laparoscopic cholecystectomy on 05/11/2023.  Procedure was uncomplicated and he was extubated and brought to the recovery room in stable position.  He was able to be discharged later that day.  His postop follow-up appointment was 05/24/2023 where he was doing well without any concerns or complaints.  He returns to clinic today stating that he has been well from a cardiac perspective.  He denies any chest pain, shortness of breath, lightheadedness/dizziness or peripheral edema.  Stated that he did undergo surgery without any complications.  Appetite is slowly increasing.  He did have a ruptured blood vessel in his that happened this morning.  He stated he will stop his clopidogrel for 10 days prior to having his gallbladder removed.  He has been compliant with his current medication regimen without any adverse events.  Denies any hospitalizations or visits to the emergency department.  States that his blood pressures have continued to run 120-140 at  home on a consistent basis.  ROS: 10 point review of systems has been reviewed and considered negative with exception was been listed in the HPI  Studies Reviewed: Marland Kitchen       Lexiscan MPI 05/06/2023 Pharmacological myocardial perfusion imaging study with no significant  ischemia Normal wall motion, EF estimated at 46%, small degree of GI uptake artifact No EKG changes concerning for ischemia at peak stress or in recovery. CT attenuation correction images with mild aortic atherosclerosis, mild coronary calcification Low risk scan  2D echo 04/27/2023 1. Left ventricular ejection fraction, by estimation, is 50 to 55%. The  left ventricle has low normal function. The left ventricle has no regional  wall motion abnormalities. Left ventricular diastolic parameters are  consistent with Grade I diastolic  dysfunction (impaired relaxation). The average left ventricular global  longitudinal strain is -10.8 %.   2. Right ventricular systolic function is normal. The right ventricular  size is normal. Tricuspid regurgitation signal is inadequate for assessing  PA pressure.   3. Left atrial size was mildly dilated.   4. The mitral valve is normal in structure. No evidence of mitral valve  regurgitation. No evidence of mitral stenosis.   5. The aortic valve is tricuspid. There is mild calcification of the  aortic valve. Aortic valve regurgitation is not visualized. Aortic valve  sclerosis/calcification is present, without any evidence of aortic  stenosis.   6. There is borderline dilatation of the ascending aorta, measuring 39  mm.   7. The inferior vena cava is normal in size with greater than 50%  respiratory variability, suggesting right  atrial pressure of 3 mmHg.   Risk Assessment/Calculations:     HYPERTENSION CONTROL Vitals:   05/31/23 1412 05/31/23 1430  BP: (!) 148/80 (!) 160/80    The patient's blood pressure is elevated above target today.  In order to address the patient's elevated  BP: Blood pressure will be monitored at home to determine if medication changes need to be made.; The blood pressure is usually elevated in clinic.  Blood pressures monitored at home have been optimal.          Physical Exam:   VS:  BP (!) 160/80 (BP Location: Left Arm, Patient Position: Sitting, Cuff Size: Large)   Pulse 66   Ht 5\' 10"  (1.778 m)   Wt 235 lb (106.6 kg)   SpO2 98%   BMI 33.72 kg/m    Wt Readings from Last 3 Encounters:  05/31/23 235 lb (106.6 kg)  05/24/23 233 lb 3.2 oz (105.8 kg)  05/11/23 230 lb (104.3 kg)    GEN: Well nourished, well developed in no acute distress NECK: No JVD; No carotid bruits CARDIAC: RRR, no murmurs, rubs, gallops RESPIRATORY:  Clear to auscultation without rales, wheezing or rhonchi  ABDOMEN: Soft, non-tender, non-distended EXTREMITIES:  No edema; No deformity   ASSESSMENT AND PLAN: .    Hypertension with a blood pressure today 148/80 and 160/80.  Patient states that her blood pressure typically runs 120-140 at home consistently.  He has not had all of his medications yet today as he was rushing to get here.  He is continued on lisinopril 10 mg in the morning and 10 mg at bedtime.  Encouraged to continue to monitor pressures 1 to 2 hours postmedication administration.  History of CVA, please continue on aspirin 81 mg daily and clopidogrel 75 mg daily.  He has no residual defects.  Clopidogrel was restarted postoperatively.  He stated that he has a hold his medication 10 days prior to surgery.  Hyperlipidemia with last LDL of 89 in 12/2022.  He is continued on rosuvastatin 5 mg daily.       Dispo: Patient return to clinic to see MD/APP in 1 year or sooner if needed  Signed, Ocie Stanzione, NP

## 2023-05-31 NOTE — Patient Instructions (Signed)
 Medication Instructions:  Your physician recommends that you continue on your current medications as directed. Please refer to the Current Medication list given to you today.   *If you need a refill on your cardiac medications before your next appointment, please call your pharmacy*   Lab Work: No labs ordered today    Testing/Procedures: No test ordered today    Follow-Up: At [facility], you and your health needs are our priority.  As part of our continuing mission to provide you with exceptional heart care, we have created designated Provider Care Teams.  These Care Teams include your primary Cardiologist (physician) and Advanced Practice Providers (APPs -  Physician Assistants and Nurse Practitioners) who all work together to provide you with the care you need, when you need it.  We recommend signing up for the patient portal called "MyChart".  Sign up information is provided on this After Visit Summary.  MyChart is used to connect with patients for Virtual Visits (Telemedicine).  Patients are able to view lab/test results, encounter notes, upcoming appointments, etc.  Non-urgent messages can be sent to your provider as well.   To learn more about what you can do with MyChart, go to [email or web address].    Your next appointment:   1 year(s)  Provider:   You may see Debbe Odea, MD or one of the following Advanced Practice Providers on your designated Care Team:   Nicolasa Ducking, NP Eula Listen, PA-C Cadence Fransico Michael, PA-C Charlsie Quest, NP Carlos Levering, NP

## 2023-07-12 LAB — BASIC METABOLIC PANEL WITH GFR
BUN: 17 (ref 4–21)
CO2: 22 (ref 13–22)
Chloride: 103 (ref 99–108)
Creatinine: 1 (ref 0.6–1.3)
Glucose: 112
Potassium: 4.5 meq/L (ref 3.5–5.1)
Sodium: 144 (ref 137–147)

## 2023-07-12 LAB — COMPREHENSIVE METABOLIC PANEL WITH GFR
Calcium: 8.8 (ref 8.7–10.7)
eGFR: 83

## 2023-07-12 LAB — LIPID PANEL
A1c: 5.9
Cholesterol: 188 (ref 0–200)
EGFR: 83
HDL: 61 (ref 35–70)
LDL Cholesterol: 19
PSA, FREE: 1
TSH: 1.12 (ref 0.41–5.90)
Triglycerides: 104 (ref 40–160)

## 2023-07-12 LAB — HEMOGLOBIN A1C: Hemoglobin A1C: 5.9

## 2023-07-12 LAB — PSA: PSA: 4.9

## 2023-07-12 LAB — TSH: TSH: 1.12 (ref 0.41–5.90)

## 2023-07-20 ENCOUNTER — Other Ambulatory Visit: Payer: Self-pay | Admitting: Internal Medicine

## 2023-07-20 DIAGNOSIS — R911 Solitary pulmonary nodule: Secondary | ICD-10-CM

## 2023-08-02 ENCOUNTER — Ambulatory Visit
Admission: RE | Admit: 2023-08-02 | Discharge: 2023-08-02 | Disposition: A | Discharge status: Home / Self Care | Source: Ambulatory Visit | Attending: Internal Medicine | Admitting: Internal Medicine

## 2023-08-02 DIAGNOSIS — R911 Solitary pulmonary nodule: Secondary | ICD-10-CM | POA: Insufficient documentation

## 2023-08-24 ENCOUNTER — Ambulatory Visit (INDEPENDENT_AMBULATORY_CARE_PROVIDER_SITE_OTHER): Admitting: Podiatry

## 2023-08-24 ENCOUNTER — Ambulatory Visit (INDEPENDENT_AMBULATORY_CARE_PROVIDER_SITE_OTHER)

## 2023-08-24 ENCOUNTER — Encounter: Payer: Self-pay | Admitting: Podiatry

## 2023-08-24 DIAGNOSIS — M2042 Other hammer toe(s) (acquired), left foot: Secondary | ICD-10-CM

## 2023-08-24 MED ORDER — DOXYCYCLINE HYCLATE 100 MG PO TABS
100.0000 mg | ORAL_TABLET | Freq: Two times a day (BID) | ORAL | 0 refills | Status: DC
Start: 2023-08-24 — End: 2023-11-03

## 2023-08-24 NOTE — Progress Notes (Signed)
 Chief Complaint  Patient presents with   Toe Pain    "I've got a toe, I guess it's a hammer toe.  It's infected." N - hammer toe L - 4th digit left D - 2 mos O - gradually worse C - tender, sore, pus A - pressure T - Triple Antibiotic Cream    HPI: 73 y.o. male presenting today as a new patient for evaluation of erythema with swelling and pain to the left fourth toe.  He believes it is possibly infected.  He does have a history of gout and self-reports taking colchicine daily from his PCP.  Past Medical History:  Diagnosis Date   Acute stroke of basal ganglia (HCC) 01/30/2013   a.) MRI brain 01/31/2013: acute infarct in LEFT lentiform nucleus (basal ganglia)   Aortic atherosclerosis (HCC)    Atypical chest pain    Bilateral inguinal hernia    Biliary colic    CAD (coronary artery disease)    a.) MV 05/06/2023: no ischemia/infarct/scar   Daily consumption of alcohol    a.) Miller light (beer) x 5/day   DDD (degenerative disc disease), lumbar    Diastolic dysfunction 01/31/2013   a.) TTE 01/31/2013: EF 50-55%, no RWMAs, G1DD, mild LAE, norm RVSF, RVSP 27.2 mmHg, AoV sclerosis without stenosis, triv PR; b.) TTE 04/27/2023: EF 50-55%, no RWMAs, G1DD, GLS -10.8%, mild MAE, mild AoV calc with no stenosis, asc Ao borderline dilated at 39 mm   Diverticulosis    Enlarged prostate    Gout    Hepatic steatosis    Hiatal hernia    History of kidney stones    HLD (hyperlipidemia)    HTN (hypertension)    Long-term use of aspirin therapy    LSC (lichen simplex chronicus) 2013   Malignant melanoma of skin of back (HCC) 01/2010   a.) pT1a pNx pMx; excised from back   On long term clopidogrel therapy    Pulmonary nodules    Squamous cell skin cancer 2014    Past Surgical History:  Procedure Laterality Date   MELANOMA EXCISION      No Known Allergies   Physical Exam: General: The patient is alert and oriented x3 in no acute distress.  Dermatology: Skin is warm, dry and  supple bilateral lower extremities.  Multiple small superficial ulcers noted overlying the DIPJ of the fourth digit of the left foot with tophaceous gout draining from the wounds.  Surrounding erythema also noted consistent with acute gout flareup  Vascular: Palpable pedal pulses bilaterally. Capillary refill within normal limits.    Neurological: Grossly intact via light touch  Musculoskeletal Exam: Soft tissue enlargement with tophaceous gout noted to the DIPJ of the fourth digit of the left foot.  Superficial ulcers noted with tophaceous gout expressed from the wounds.  No purulence.  No malodor.  This appears to be purely gout related and not associated to infection  Radiographic Exam LT foot 08/24/2023:  Normal osseous mineralization. Joint spaces preserved.  No fractures or osseous irregularities noted.  Impression: Negative  Assessment/Plan of Care: 1.  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  X-rays reviewed -Due to the pain and sensitivity I did decide to perform a digital block to the toe consisting of 3 mL of 2% lidocaine  plain.  Toe was prepped in aseptic manner and the tophaceous gout was expressed from the distal tip of the toe.  Approximately 1 mL of tophaceous gout was expressed from the toe followed by dry  sterile dressings.  Post care instructions provided -To ensure there is no surrounding cellulitis I did go ahead and call in a prescription for doxycycline 100 mg twice daily x 10 days -Continue management of chronic gout with PCP -Return to clinic 1 week       Dot Gazella, DPM [facility]  Dr. Dot Gazella, DPM    [address] [place], [place] [address]                Office [phone]  Fax [phone]

## 2023-08-31 ENCOUNTER — Ambulatory Visit (INDEPENDENT_AMBULATORY_CARE_PROVIDER_SITE_OTHER): Admitting: Podiatry

## 2023-08-31 ENCOUNTER — Encounter: Payer: Self-pay | Admitting: Podiatry

## 2023-08-31 VITALS — Ht 70.0 in | Wt 235.0 lb

## 2023-08-31 DIAGNOSIS — M10072 Idiopathic gout, left ankle and foot: Secondary | ICD-10-CM | POA: Diagnosis not present

## 2023-08-31 NOTE — Progress Notes (Signed)
 Chief Complaint  Patient presents with   Hammer Toe    Pt is here to f/u on left foot hammer toe.    HPI: 73 y.o. male presenting today for follow-up evaluation of gout associated to the left fourth toe x 30 years.  He says that it feels significantly better.  Overall improvement.  Past Medical History:  Diagnosis Date   Acute stroke of basal ganglia (HCC) 01/30/2013   a.) MRI brain 01/31/2013: acute infarct in LEFT lentiform nucleus (basal ganglia)   Aortic atherosclerosis (HCC)    Atypical chest pain    Bilateral inguinal hernia    Biliary colic    CAD (coronary artery disease)    a.) MV 05/06/2023: no ischemia/infarct/scar   Daily consumption of alcohol    a.) Miller light (beer) x 5/day   DDD (degenerative disc disease), lumbar    Diastolic dysfunction 01/31/2013   a.) TTE 01/31/2013: EF 50-55%, no RWMAs, G1DD, mild LAE, norm RVSF, RVSP 27.2 mmHg, AoV sclerosis without stenosis, triv PR; b.) TTE 04/27/2023: EF 50-55%, no RWMAs, G1DD, GLS -10.8%, mild MAE, mild AoV calc with no stenosis, asc Ao borderline dilated at 39 mm   Diverticulosis    Enlarged prostate    Gout    Hepatic steatosis    Hiatal hernia    History of kidney stones    HLD (hyperlipidemia)    HTN (hypertension)    Long-term use of aspirin therapy    LSC (lichen simplex chronicus) 2013   Malignant melanoma of skin of back (HCC) 01/2010   a.) pT1a pNx pMx; excised from back   On long term clopidogrel therapy    Pulmonary nodules    Squamous cell skin cancer 2014    Past Surgical History:  Procedure Laterality Date   MELANOMA EXCISION      No Known Allergies   Physical Exam: General: The patient is alert and oriented x3 in no acute distress.  Dermatology: Skin is warm, dry and supple bilateral lower extremities.  Multiple small superficial ulcers noted overlying the DIPJ of the fourth digit of the left foot with tophaceous gout draining from the wounds.  The erythema has improved  significantly  Vascular: Palpable pedal pulses bilaterally. Capillary refill within normal limits.    Neurological: Grossly intact via light touch  Musculoskeletal Exam: There continues to be tophaceous gout to the DIPJ of the fourth digit of the left foot with light debridement of the superficial wounds.  Radiographic Exam LT foot 08/24/2023:  Normal osseous mineralization. Joint spaces preserved.  No fractures or osseous irregularities noted.  Impression: Negative  Assessment/Plan of Care: 1.  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   - Additional digital block was administered today and the toe was prepped in aseptic manner and the tophaceous gout was expressed from the distal tip of the toe.  Approximately 1 mL of tophaceous gout was expressed from the toe followed by dry sterile dressings.  Post care instructions provided - Continue doxycycline 100 mg twice daily x 10 days until completed -Continue management of chronic gout with PCP - Overall significant improvement.  Return to clinic PRN    Dot Gazella, DPM [facility]  Dr. Dot Gazella, DPM    [address]  [place], [place] [address]                Office [phone]  Fax [phone]

## 2023-10-22 ENCOUNTER — Telehealth: Payer: Self-pay | Admitting: Cardiology

## 2023-10-22 NOTE — Telephone Encounter (Signed)
   Pre-operative Risk Assessment    Patient Name: Joseph Frye  DOB: October 15, 1950 MRN: [id number]   Date of last office visit: 05/31/23 Date of next office visit: n/a   Request for Surgical Clearance    Procedure:  colonoscopy  Date of Surgery:  Clearance 11/17/23                                Surgeon:  Dr. Francis Boss Surgeon's Group or Practice Name:  [facility] Department Phone number:  [phone] Fax number:  [phone]   Type of Clearance Requested:   - Pharmacy:  Hold Clopidogrel (Plavix) follow instruction - to be discontinued preoperatively   Type of Anesthesia:  Not Indicated   Additional requests/questions:    Signed, Gwendolyn H Slade   10/22/2023, 8:54 AM

## 2023-10-22 NOTE — Telephone Encounter (Signed)
   Patient Name: Joseph Frye  DOB: 1950-10-07 MRN: [id number]  Primary Cardiologist: Redell Cave, MD  Received request from [facility] regarding holding patient's Plavix. Per chart review, patient takes Plavix secondary to prior stroke. This medication is not managed by cardiology and recommendations will need to come from current Plavix prescriber. Will fax back to requesting team to inform.   Artist Pouch, PA-C 10/22/2023, 9:09 AM

## 2023-11-03 ENCOUNTER — Encounter: Payer: Self-pay | Admitting: Family Medicine

## 2023-11-03 ENCOUNTER — Ambulatory Visit: Admitting: Family Medicine

## 2023-11-03 VITALS — BP 160/83 | HR 74 | Ht 69.0 in | Wt 237.0 lb

## 2023-11-03 DIAGNOSIS — I10 Essential (primary) hypertension: Secondary | ICD-10-CM

## 2023-11-03 DIAGNOSIS — J432 Centrilobular emphysema: Secondary | ICD-10-CM

## 2023-11-03 DIAGNOSIS — Z8673 Personal history of transient ischemic attack (TIA), and cerebral infarction without residual deficits: Secondary | ICD-10-CM | POA: Diagnosis not present

## 2023-11-03 DIAGNOSIS — R7303 Prediabetes: Secondary | ICD-10-CM

## 2023-11-03 DIAGNOSIS — M25512 Pain in left shoulder: Secondary | ICD-10-CM

## 2023-11-03 DIAGNOSIS — K5790 Diverticulosis of intestine, part unspecified, without perforation or abscess without bleeding: Secondary | ICD-10-CM | POA: Insufficient documentation

## 2023-11-03 DIAGNOSIS — G8929 Other chronic pain: Secondary | ICD-10-CM | POA: Insufficient documentation

## 2023-11-03 DIAGNOSIS — E782 Mixed hyperlipidemia: Secondary | ICD-10-CM

## 2023-11-03 DIAGNOSIS — Z7689 Persons encountering health services in other specified circumstances: Secondary | ICD-10-CM

## 2023-11-03 DIAGNOSIS — E279 Disorder of adrenal gland, unspecified: Secondary | ICD-10-CM

## 2023-11-03 DIAGNOSIS — Z23 Encounter for immunization: Secondary | ICD-10-CM

## 2023-11-03 DIAGNOSIS — I700 Atherosclerosis of aorta: Secondary | ICD-10-CM

## 2023-11-03 DIAGNOSIS — R911 Solitary pulmonary nodule: Secondary | ICD-10-CM

## 2023-11-03 DIAGNOSIS — M10079 Idiopathic gout, unspecified ankle and foot: Secondary | ICD-10-CM

## 2023-11-03 DIAGNOSIS — M545 Low back pain, unspecified: Secondary | ICD-10-CM

## 2023-11-03 MED ORDER — COLCHICINE 0.6 MG PO TABS: 0.6000 mg | ORAL_TABLET | ORAL | 3 refills | Status: AC

## 2023-11-03 MED ORDER — LISINOPRIL 20 MG PO TABS: 20.0000 mg | ORAL_TABLET | Freq: Every day | ORAL | 3 refills | Status: AC

## 2023-11-03 MED ORDER — EZETIMIBE 10 MG PO TABS: 10.0000 mg | ORAL_TABLET | Freq: Every day | ORAL | 3 refills | Status: AC

## 2023-11-03 MED ORDER — CLOPIDOGREL BISULFATE 75 MG PO TABS: 75.0000 mg | ORAL_TABLET | Freq: Every day | ORAL | 3 refills | Status: AC

## 2023-11-03 MED ORDER — MELOXICAM 15 MG PO TABS
15.0000 mg | ORAL_TABLET | Freq: Every day | ORAL | 2 refills | Status: AC | PRN
Start: 2023-11-03 — End: ?

## 2023-11-03 NOTE — Progress Notes (Signed)
 New patient visit   Patient: Joseph Frye   DOB: 09-Mar-1951   73 y.o. Male  MRN: [id number] Visit Date: 11/03/2023  Today's healthcare provider: LAURAINE LOISE BUOY, DO   Chief Complaint  Patient presents with   New Patient (Initial Visit)    Colonoscopy - Dr.Toledo    Subjective    Joseph Frye is a 73 y.o. male who presents today as a new patient to establish care.   HPI HPI     New Patient (Initial Visit)    Additional comments: Colonoscopy - Dr.Toledo       Last edited by Lilian Fitzpatrick, CMA on 11/03/2023  1:53 PM.      Joseph Frye is a 73 year old male who presents for a follow-up visit.  He experienced a stroke ten and a half years ago, initially presenting with loss of reading comprehension and speaking in 'foreign languages'. Two episodes occurred within a week, with the second major episode characterized by aphasia and facial asymmetry. No residual deficits such as weakness, numbness, or tingling are reported.  He has a history of hypertension, managed with lisinopril , taking two 10 mg tablets in the morning. He also has high cholesterol and takes rosuvastatin once a week due to joint pain with more frequent dosing. His LDL was noted to be 108 mg/dL.  He has a history of gout, primarily affecting his toes and occasionally his ankle. He takes colchicine  every other day and has not had recent episodes.  He underwent a cholecystectomy in February after experiencing severe chest pain, initially suspected to be cardiac in origin but later attributed to gallbladder issues. No further chest pain has been reported since the surgery.  He reports a history of emphysema but has no wheezing or shortness of breath. He quit smoking 40 years ago.  He experiences occasional low back pain, predominantly on the right side, for which he takes meloxicam  as needed. He also reports left shoulder pain, previously thought to be due to a bone spur.  He has a history of  prostate monitoring with PSA tests every six months, with the most recent level at 4.9 ng/mL. He reports occasional nocturia, getting up once or twice a night, and some dribbling but no significant urinary issues.  He has a history of lung and adrenal nodules, which were scanned and determined to be benign. He reports no symptoms such as headaches, shortness of breath, or dizziness.     Past Medical History:  Diagnosis Date   Acute stroke of basal ganglia (HCC) 01/30/2013   a.) MRI brain 01/31/2013: acute infarct in LEFT lentiform nucleus (basal ganglia)   Aortic atherosclerosis (HCC)    Atypical chest pain    Bilateral inguinal hernia    Biliary colic    CAD (coronary artery disease)    a.) MV 05/06/2023: no ischemia/infarct/scar   Daily consumption of alcohol    a.) Miller light (beer) x 5/day   DDD (degenerative disc disease), lumbar    Diastolic dysfunction 01/31/2013   a.) TTE 01/31/2013: EF 50-55%, no RWMAs, G1DD, mild LAE, norm RVSF, RVSP 27.2 mmHg, AoV sclerosis without stenosis, triv PR; b.) TTE 04/27/2023: EF 50-55%, no RWMAs, G1DD, GLS -10.8%, mild MAE, mild AoV calc with no stenosis, asc Ao borderline dilated at 39 mm   Diverticulosis    Enlarged prostate    Gout    Hepatic steatosis    Hiatal hernia    History of kidney stones  HLD (hyperlipidemia)    HTN (hypertension)    Long-term use of aspirin therapy    LSC (lichen simplex chronicus) 2013   Malignant melanoma of skin of back (HCC) 01/2010   a.) pT1a pNx pMx; excised from back   On long term clopidogrel  therapy    Pulmonary nodules    Squamous cell skin cancer 2014   Past Surgical History:  Procedure Laterality Date   MELANOMA EXCISION     Family Status  Relation Name Status   Mother  Deceased   Father  Deceased   Sister  Deceased   MGM  (Not Specified)   MGF  (Not Specified)  No partnership data on file   Family History  Problem Relation Age of Onset   Breast cancer Mother    AAA (abdominal  aortic aneurysm) Father    Cervical cancer Sister    Breast cancer Maternal Grandmother    Heart attack Maternal Grandfather    Social History   Socioeconomic History   Marital status: Married    Spouse name: Not on file   Number of children: Not on file   Years of education: Not on file   Highest education level: Not on file  Occupational History   Not on file  Tobacco Use   Smoking status: Never    Passive exposure: Never   Smokeless tobacco: Never  Vaping Use   Vaping status: Never Used  Substance and Sexual Activity   Alcohol use: Yes    Comment: daily ( 3 beers a day)   Drug use: Never   Sexual activity: Not on file  Other Topics Concern   Not on file  Social History Narrative   Not on file   Social Drivers of Health   Financial Resource Strain: Low Risk  (10/19/2023)   Received from [facility]   Overall Financial Resource Strain (CARDIA)    Difficulty of Paying Living Expenses: Not hard at all  Food Insecurity: No Food Insecurity (10/19/2023)   Received from [facility]   Hunger Vital Sign    Within the past 12 months, you worried that your food would run out before you got the money to buy more.: Never true    Within the past 12 months, the food you bought just didn't last and you didn't have money to get more.: Never true  Transportation Needs: No Transportation Needs (10/19/2023)   Received from [facility] - Transportation    In the past 12 months, has lack of transportation kept you from medical appointments or from getting medications?: No    Lack of Transportation (Non-Medical): No  Physical Activity: Not on file  Stress: Not on file  Social Connections: Not on file   Outpatient Medications Prior to Visit  Medication Sig Note   aspirin EC 81 MG tablet Take 81 mg by mouth daily. Swallow whole.    Coenzyme Q10 (CO Q10 PO) Take 1 capsule by mouth daily.    esomeprazole (NEXIUM) 20 MG packet  Take 20 mg by mouth daily as needed.    methocarbamol (ROBAXIN) 500 MG tablet Take 500 mg by mouth daily as needed.    nitroGLYCERIN (NITROSTAT) 0.4 MG SL tablet Place 0.4 mg under the tongue every 5 (five) minutes as needed for chest pain.    rosuvastatin (CRESTOR) 5 MG tablet Take 5 mg by mouth every other day. (Patient taking differently: Take 5 mg by mouth once a week.)    [  DISCONTINUED] clopidogrel  (PLAVIX ) 75 MG tablet Take 75 mg by mouth daily.    [DISCONTINUED] colchicine  0.6 MG tablet Take 0.6 mg by mouth every other day.    [DISCONTINUED] lisinopril  (ZESTRIL ) 10 MG tablet Take 10 mg by mouth in the morning and at bedtime. 11/03/2023: switch to combined dose (pt was taking both 10mg  pills together)   [DISCONTINUED] meloxicam  (MOBIC ) 15 MG tablet Take 15 mg by mouth daily as needed for pain.    Multiple Vitamins-Minerals (CENTRUM SILVER 50+MEN PO) Take 1 tablet by mouth daily.    [DISCONTINUED] doxycycline  (VIBRA -TABS) 100 MG tablet Take 1 tablet (100 mg total) by mouth 2 (two) times daily. (Patient not taking: Reported on 11/03/2023)    No facility-administered medications prior to visit.   No Known Allergies  Immunization History  Administered Date(s) Administered   PFIZER(Purple Top)SARS-COV-2 Vaccination 05/12/2019, 06/02/2019, 03/21/2020   PNEUMOCOCCAL CONJUGATE-20 11/03/2023   Pneumococcal Conjugate-13 12/31/2014   Zoster Recombinant(Shingrix) 07/27/2017, 01/13/2018    Health Maintenance  Topic Date Due   Medicare Annual Wellness (AWV)  Never done   Hepatitis C Screening  Never done   DTaP/Tdap/Td (1 - Tdap) Never done   Colonoscopy  Never done   INFLUENZA VACCINE  11/05/2023   COVID-19 Vaccine (4 - 2024-25 season) 01/05/2024 (Originally 12/06/2022)   Pneumococcal Vaccine: 50+ Years  Completed   Zoster Vaccines- Shingrix  Completed   Hepatitis B Vaccines  Aged Out   HPV VACCINES  Aged Out   Meningococcal B Vaccine  Aged Out    Patient Care Team: Vedha Tercero, Lauraine SAILOR, DO as  PCP - General (Family Medicine) Darliss Rogue, MD as PCP - Cardiology (Cardiology)       Objective    BP (!) 160/83 (BP Location: Left Arm, Patient Position: Sitting, Cuff Size: Large)   Pulse 74   Ht 5' 9 (1.753 m)   Wt 237 lb (107.5 kg)   SpO2 99%   BMI 35.00 kg/m     Physical Exam Vitals reviewed.  Constitutional:      General: He is not in acute distress.    Appearance: Normal appearance. He is not diaphoretic.  HENT:     Head: Normocephalic and atraumatic.  Eyes:     General: No scleral icterus.    Conjunctiva/sclera: Conjunctivae normal.  Cardiovascular:     Rate and Rhythm: Normal rate and regular rhythm.     Pulses: Normal pulses.     Heart sounds: Normal heart sounds. No murmur heard. Pulmonary:     Effort: Pulmonary effort is normal. No respiratory distress.     Breath sounds: Normal breath sounds. No wheezing or rhonchi.  Musculoskeletal:     Cervical back: Neck supple.     Right lower leg: No edema.     Left lower leg: No edema.  Lymphadenopathy:     Cervical: No cervical adenopathy.  Skin:    General: Skin is warm and dry.     Findings: No rash.  Neurological:     Mental Status: He is alert and oriented to person, place, and time. Mental status is at baseline.  Psychiatric:        Mood and Affect: Mood normal.        Behavior: Behavior normal.      Depression Screen    11/03/2023    1:33 PM  PHQ 2/9 Scores  PHQ - 2 Score 0   Results for orders placed or performed in visit on 11/03/23  Basic metabolic panel with GFR  Result Value Ref Range   Glucose 112    BUN 17 4 - 21   CO2 22 13 - 22   Creatinine 1.0 0.6 - 1.3   Potassium 4.5 3.5 - 5.1 mEq/L   Sodium 144 137 - 147   Chloride 103 99 - 108  Comprehensive metabolic panel with GFR  Result Value Ref Range   eGFR 83    Calcium 8.8 8.7 - 10.7  Lipid panel  Result Value Ref Range   Triglycerides 104 40 - 160   Cholesterol 188 0 - 200   HDL 61 35 - 70   LDL Cholesterol 19    Hemoglobin A1c  Result Value Ref Range   Hemoglobin A1C 5.9   PSA  Result Value Ref Range   PSA 4.9   TSH  Result Value Ref Range   TSH 1.12 0.41 - 5.90    Assessment & Plan     Centrilobular emphysema (HCC)  Establishing care with new doctor, encounter for  Essential hypertension -     Lisinopril ; Take 1 tablet (20 mg total) by mouth daily.  Dispense: 90 tablet; Refill: 3  History of stroke -     Clopidogrel  Bisulfate; Take 1 tablet (75 mg total) by mouth daily.  Dispense: 90 tablet; Refill: 3  Mixed hyperlipidemia -     Ezetimibe ; Take 1 tablet (10 mg total) by mouth daily.  Dispense: 90 tablet; Refill: 3  Prediabetes  Idiopathic gout involving toe, unspecified chronicity, unspecified laterality -     Colchicine ; Take 1 tablet (0.6 mg total) by mouth every other day.  Dispense: 90 tablet; Refill: 3  Chronic right-sided low back pain without sciatica -     Meloxicam ; Take 1 tablet (15 mg total) by mouth daily as needed for pain. MAX dose; do NOT take other NSAIDs with this.  Dispense: 90 tablet; Refill: 2  Chronic left shoulder pain -     Meloxicam ; Take 1 tablet (15 mg total) by mouth daily as needed for pain. MAX dose; do NOT take other NSAIDs with this.  Dispense: 90 tablet; Refill: 2  Lung nodule seen on imaging study  Adrenal nodule (HCC)  Diverticulosis  Aortic atherosclerosis (HCC)  Immunization due -     Pneumococcal conjugate vaccine 20-valent      Establishing care with a new doctor, encounter for Discussed COVID-19 boosters and PSA testing frequency. - Continue PSA testing annually as preferred.  Hyperlipidemia in the setting of prior ischemic stroke LDL remains elevated at 108 despite rosuvastatin, which is poorly tolerated due to joint pain. Discussed alternative statins and ezetimibe . - Prescribe ezetimibe  in addition to rosuvastatin. - Continue rosuvastatin weekly; plan to refill rosuvastatin when needed.  Hypertension Blood pressure  control was better with lisinopril  10 mg twice-daily dosing, but adherence is an issue with evening doses. - Switch lisinopril  to a single 20 mg tablet daily to improve adherence.  Gout Infrequent episodes managed with colchicine . He is aware of acute management dosing. - Continue colchicine  every other day for prophylaxis. - Educated on acute gout management: take two colchicine  tablets at onset, then one more an hour later, and resume prophylactic dosing the next day. - Plan to send additional colchicine  as needed for acute flares.  Prediabetes Noted. No acute concerns.  Continue to monitor.  Recheck A1c at next visit  Right-sided low back pain Pain responds well to meloxicam . - Continue meloxicam  as needed for low back pain.  Left shoulder pain, possible bone spur Intermittent pain  with possible bone spur diagnosed five years ago. No acute concerns.  Continue to monitor.    Benign prostatic hyperplasia with mild lower urinary tract symptoms Mild symptoms not bothersome enough to warrant medication. - Consider tamsulosin or Flomax if symptoms worsen.  Emphysema (COPD) Diagnosed via CT scan. Quit smoking 40 years ago.  Denies symptoms at this time.  Plan to initiate inhaler if this changes.  Pulmonary nodule, under surveillance - Repeat CT scan in 18-24 months unless high-risk factors develop.  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 - 13 mm right adrenal nodule seen on CT abdomen/pelvis with contrast 04/07/2023    -> plan to evaluate with adrenal protocol CT at 1 year.  Aortic atherosclerosis Continue to optimize risk factors.  Diverticulosis Noted. No acute concerns.  Continue to monitor.      Return in about 6 months (around 05/05/2024) for Chronic f/u, and for mAWV with AWV nurse.     I discussed the assessment and treatment plan with the patient  The patient was provided an opportunity to ask  questions and all were answered. The patient agreed with the plan and demonstrated an understanding of the instructions.   The patient was advised to call back or seek an in-person evaluation if the symptoms worsen or if the condition fails to improve as anticipated.    LAURAINE LOISE BUOY, DO  [facility] [phone] (phone) [phone] (fax)  [facility]

## 2023-11-03 NOTE — Patient Instructions (Signed)
 For acute gout, take 2 tablets of colchicine , then another 1 hour later and start your preventative dosing again after that.

## 2023-11-17 ENCOUNTER — Encounter
Admission: RE | Disposition: A | Discharge status: Home / Self Care | Payer: Self-pay | Source: Home / Self Care | Attending: Internal Medicine

## 2023-11-17 ENCOUNTER — Ambulatory Visit: Admitting: Anesthesiology

## 2023-11-17 ENCOUNTER — Ambulatory Visit
Admission: RE | Admit: 2023-11-17 | Discharge: 2023-11-17 | Disposition: A | Discharge status: Home / Self Care | Attending: Internal Medicine | Admitting: Internal Medicine

## 2023-11-17 ENCOUNTER — Encounter: Payer: Self-pay | Admitting: Internal Medicine

## 2023-11-17 ENCOUNTER — Other Ambulatory Visit: Payer: Self-pay

## 2023-11-17 DIAGNOSIS — I10 Essential (primary) hypertension: Secondary | ICD-10-CM | POA: Insufficient documentation

## 2023-11-17 DIAGNOSIS — Z7902 Long term (current) use of antithrombotics/antiplatelets: Secondary | ICD-10-CM | POA: Diagnosis not present

## 2023-11-17 DIAGNOSIS — K640 First degree hemorrhoids: Secondary | ICD-10-CM | POA: Diagnosis not present

## 2023-11-17 DIAGNOSIS — I2510 Atherosclerotic heart disease of native coronary artery without angina pectoris: Secondary | ICD-10-CM | POA: Insufficient documentation

## 2023-11-17 DIAGNOSIS — K5730 Diverticulosis of large intestine without perforation or abscess without bleeding: Secondary | ICD-10-CM | POA: Diagnosis not present

## 2023-11-17 DIAGNOSIS — Z09 Encounter for follow-up examination after completed treatment for conditions other than malignant neoplasm: Secondary | ICD-10-CM | POA: Diagnosis present

## 2023-11-17 DIAGNOSIS — D122 Benign neoplasm of ascending colon: Secondary | ICD-10-CM | POA: Diagnosis not present

## 2023-11-17 DIAGNOSIS — E785 Hyperlipidemia, unspecified: Secondary | ICD-10-CM | POA: Insufficient documentation

## 2023-11-17 DIAGNOSIS — Z85828 Personal history of other malignant neoplasm of skin: Secondary | ICD-10-CM | POA: Diagnosis not present

## 2023-11-17 DIAGNOSIS — I700 Atherosclerosis of aorta: Secondary | ICD-10-CM | POA: Insufficient documentation

## 2023-11-17 DIAGNOSIS — D123 Benign neoplasm of transverse colon: Secondary | ICD-10-CM | POA: Insufficient documentation

## 2023-11-17 HISTORY — PX: POLYPECTOMY: SHX149

## 2023-11-17 HISTORY — PX: COLONOSCOPY: SHX5424

## 2023-11-17 SURGERY — COLONOSCOPY
Anesthesia: General

## 2023-11-17 MED ORDER — DEXMEDETOMIDINE HCL IN NACL 80 MCG/20ML IV SOLN
INTRAVENOUS | Status: DC | PRN
  Administered 2023-11-17 (×2): 8 ug via INTRAVENOUS
  Administered 2023-11-17 (×2): 12 ug via INTRAVENOUS

## 2023-11-17 MED ORDER — PROPOFOL 10 MG/ML IV BOLUS
INTRAVENOUS | Status: DC | PRN
  Administered 2023-11-17 (×3): 40 mg via INTRAVENOUS
  Administered 2023-11-17: 20 mg via INTRAVENOUS
  Administered 2023-11-17: 40 mg via INTRAVENOUS
  Administered 2023-11-17: 20 mg via INTRAVENOUS

## 2023-11-17 MED ORDER — SODIUM CHLORIDE 0.9 % IV SOLN: INTRAVENOUS | Status: DC

## 2023-11-17 MED ORDER — PROPOFOL 500 MG/50ML IV EMUL
INTRAVENOUS | Status: DC | PRN
  Administered 2023-11-17 (×2): 75 ug/kg/min via INTRAVENOUS

## 2023-11-17 MED ORDER — LIDOCAINE HCL (PF) 2 % IJ SOLN
INTRAMUSCULAR | Status: AC
  Filled 2023-11-17: qty 5

## 2023-11-17 MED ORDER — LIDOCAINE HCL (CARDIAC) PF 100 MG/5ML IV SOSY
PREFILLED_SYRINGE | INTRAVENOUS | Status: DC | PRN
  Administered 2023-11-17 (×2): 80 mg via INTRAVENOUS

## 2023-11-17 NOTE — Anesthesia Postprocedure Evaluation (Signed)
 Anesthesia Post Note  Patient: Joseph Frye  Procedure(s) Performed: COLONOSCOPY POLYPECTOMY, INTESTINE  Patient location during evaluation: Endoscopy Anesthesia Type: General Level of consciousness: awake and alert Pain management: pain level controlled Vital Signs Assessment: post-procedure vital signs reviewed and stable Respiratory status: spontaneous breathing, nonlabored ventilation, respiratory function stable and patient connected to nasal cannula oxygen Cardiovascular status: blood pressure returned to baseline and stable Postop Assessment: no apparent nausea or vomiting Anesthetic complications: no   No notable events documented.   Last Vitals:  Vitals:   11/17/23 1226 11/17/23 1236  BP: 114/60 105/74  Pulse: 82 71  Resp: 14 (!) 8  Temp: (!) 36.1 C (!) 36.1 C  SpO2: 98% 97%    Last Pain:  Vitals:   11/17/23 1236  TempSrc: Temporal  PainSc: 0-No pain                 Lendia LITTIE Mae

## 2023-11-17 NOTE — Op Note (Signed)
 [facility] Gastroenterology Patient Name: Joseph Frye Procedure Date: 11/17/2023 12:07 PM MRN: [id number] Account #: [id number] Date of Birth: 23-Mar-1951 Admit Type: Outpatient Age: 73 Room: [facility] ENDO ROOM 2 Gender: Male Note Status: Finalized Instrument Name: Colon Scope [id number] Procedure:             Colonoscopy Indications:           High risk colon cancer surveillance: Personal history                         of non-advanced adenoma Providers:             Ellamarie Naeve K. Aundria MD, MD Referring MD:          Lauraine GEANNIE Buoy (Referring MD) Medicines:             Propofol  per Anesthesia Complications:         No immediate complications. Estimated blood loss:                         Minimal. Procedure:             Pre-Anesthesia Assessment:                        - The risks and benefits of the procedure and the                         sedation options and risks were discussed with the                         patient. All questions were answered and informed                         consent was obtained.                        - Patient identification and proposed procedure were                         verified prior to the procedure by the nurse. The                         procedure was verified in the procedure room.                        - ASA Grade Assessment: III - A patient with severe                         systemic disease.                        - After reviewing the risks and benefits, the patient                         was deemed in satisfactory condition to undergo the                         procedure.                        After obtaining informed consent, the  colonoscope was                         passed under direct vision. Throughout the procedure,                         the patient's blood pressure, pulse, and oxygen                         saturations were monitored continuously. The                         Colonoscope was introduced  through the anus and                         advanced to the the cecum, identified by appendiceal                         orifice and ileocecal valve. The colonoscopy was                         performed without difficulty. The patient tolerated                         the procedure well. The quality of the bowel                         preparation was good. The ileocecal valve, appendiceal                         orifice, and rectum were photographed. Findings:      The perianal and digital rectal examinations were normal. Pertinent       negatives include normal sphincter tone and no palpable rectal lesions.      Non-bleeding internal hemorrhoids were found during retroflexion. The       hemorrhoids were Grade I (internal hemorrhoids that do not prolapse).      Many medium-mouthed and small-mouthed diverticula were found in the       sigmoid colon. There was no evidence of diverticular bleeding.      A 4 mm polyp was found in the ascending colon. The polyp was sessile.       The polyp was removed with a cold biopsy forceps. Resection and       retrieval were complete. Estimated blood loss was minimal.      A 12 mm polyp was found in the transverse colon. The polyp was       semi-pedunculated. The polyp was removed with a hot snare. Resection and       retrieval were complete. Estimated blood loss: none.      There was a small lipoma, in the transverse colon.      The exam was otherwise without abnormality. Impression:            - Non-bleeding internal hemorrhoids.                        - Mild diverticulosis in the sigmoid colon. There was                         no evidence of diverticular bleeding.                        -  One 4 mm polyp in the ascending colon, removed with                         a cold biopsy forceps. Resected and retrieved.                        - One 12 mm polyp in the transverse colon, removed                         with a hot snare. Resected and retrieved.                         - Small lipoma in the transverse colon.                        - The examination was otherwise normal. Recommendation:        - Patient has a contact number available for                         emergencies. The signs and symptoms of potential                         delayed complications were discussed with the patient.                         Return to normal activities tomorrow. Written                         discharge instructions were provided to the patient.                        - Resume previous diet.                        - Continue present medications.                        - Repeat colonoscopy in 3 - 5 years for surveillance                         based on pathology results.                        - Return to GI office PRN.                        - The findings and recommendations were discussed with                         the patient. Procedure Code(s):     --- Professional ---                        [phone], Colonoscopy, flexible; with removal of                         tumor(s), polyp(s), or other lesion(s) by snare                         technique  54619, 59, Colonoscopy, flexible; with biopsy, single                         or multiple Diagnosis Code(s):     --- Professional ---                        K57.30, Diverticulosis of large intestine without                         perforation or abscess without bleeding                        D17.5, Benign lipomatous neoplasm of intra-abdominal                         organs                        D12.3, Benign neoplasm of transverse colon (hepatic                         flexure or splenic flexure)                        D12.2, Benign neoplasm of ascending colon                        K64.0, First degree hemorrhoids                        Z86.010, Personal history of colonic polyps CPT copyright 2022 American [employer]. All rights reserved. The codes documented in  this report are preliminary and upon coder review may  be revised to meet current compliance requirements. Ladell MARLA Boss MD, MD 11/17/2023 12:25:52 PM This report has been signed electronically. Number of Addenda: 0 Note Initiated On: 11/17/2023 12:07 PM Scope Withdrawal Time: 0 hours 6 minutes 24 seconds  Total Procedure Duration: 0 hours 8 minutes 32 seconds  Estimated Blood Loss:  Estimated blood loss was minimal.      [facility]

## 2023-11-17 NOTE — Anesthesia Preprocedure Evaluation (Signed)
 Anesthesia Evaluation  Patient identified by MRN, date of birth, ID band Patient awake    Reviewed: Allergy & Precautions, NPO status , Patient's Chart, lab work & pertinent test results  History of Anesthesia Complications Negative for: history of anesthetic complications  Airway Mallampati: III  TM Distance: >3 FB Neck ROM: full    Dental  (+) Chipped   Pulmonary neg pulmonary ROS   Pulmonary exam normal        Cardiovascular hypertension, On Medications + CAD  Normal cardiovascular exam  ECHO  IMPRESSIONS     1. Left ventricular ejection fraction, by estimation, is 50 to 55%. The  left ventricle has low normal function. The left ventricle has no regional  wall motion abnormalities. Left ventricular diastolic parameters are  consistent with Grade I diastolic  dysfunction (impaired relaxation). The average left ventricular global  longitudinal strain is -10.8 %.   2. Right ventricular systolic function is normal. The right ventricular  size is normal. Tricuspid regurgitation signal is inadequate for assessing  PA pressure.   3. Left atrial size was mildly dilated.   4. The mitral valve is normal in structure. No evidence of mitral valve  regurgitation. No evidence of mitral stenosis.   5. The aortic valve is tricuspid. There is mild calcification of the  aortic valve. Aortic valve regurgitation is not visualized. Aortic valve  sclerosis/calcification is present, without any evidence of aortic  stenosis.   6. There is borderline dilatation of the ascending aorta, measuring 39  mm.   7. The inferior vena cava is normal in size with greater than 50%  respiratory variability, suggesting right atrial pressure of 3 mmHg.     Neuro/Psych  Neuromuscular disease CVA  negative psych ROS   GI/Hepatic hiatal hernia,,,(+)     substance abuse  alcohol use  Endo/Other  negative endocrine ROS    Renal/GU negative Renal ROS   negative genitourinary   Musculoskeletal   Abdominal   Peds  Hematology negative hematology ROS (+)   Anesthesia Other Findings Past Medical History: 01/30/2013: Acute stroke of basal ganglia (HCC)     Comment:  a.) MRI brain 01/31/2013: acute infarct in LEFT               lentiform nucleus (basal ganglia) No date: Aortic atherosclerosis (HCC) No date: Atypical chest pain No date: Bilateral inguinal hernia No date: Biliary colic No date: CAD (coronary artery disease)     Comment:  a.) MV 05/06/2023: no ischemia/infarct/scar No date: Daily consumption of alcohol     Comment:  a.) Miller light (beer) x 5/day No date: DDD (degenerative disc disease), lumbar 01/31/2013: Diastolic dysfunction     Comment:  a.) TTE 01/31/2013: EF 50-55%, no RWMAs, G1DD, mild LAE,              norm RVSF, RVSP 27.2 mmHg, AoV sclerosis without               stenosis, triv PR; b.) TTE 04/27/2023: EF 50-55%, no               RWMAs, G1DD, GLS -10.8%, mild MAE, mild AoV calc with no               stenosis, asc Ao borderline dilated at 39 mm No date: Diverticulosis No date: Enlarged prostate No date: Gout No date: Hepatic steatosis No date: Hiatal hernia No date: History of kidney stones No date: HLD (hyperlipidemia) No date: HTN (hypertension) No date: Long-term use of  aspirin therapy 2013: LSC (lichen simplex chronicus) 01/2010: Malignant melanoma of skin of back (HCC)     Comment:  a.) pT1a pNx pMx; excised from back No date: On long term clopidogrel  therapy No date: Pulmonary nodules 2014: Squamous cell skin cancer  Past Surgical History: No date: MELANOMA EXCISION     Reproductive/Obstetrics negative OB ROS                              Anesthesia Physical Anesthesia Plan  ASA: 3  Anesthesia Plan: General   Post-op Pain Management: Minimal or no pain anticipated   Induction: Intravenous  PONV Risk Score and Plan: 1 and Propofol  infusion and  TIVA  Airway Management Planned: Natural Airway and Nasal Cannula  Additional Equipment:   Intra-op Plan:   Post-operative Plan:   Informed Consent: I have reviewed the patients History and Physical, chart, labs and discussed the procedure including the risks, benefits and alternatives for the proposed anesthesia with the patient or authorized representative who has indicated his/her understanding and acceptance.     Dental Advisory Given  Plan Discussed with: Anesthesiologist, CRNA and Surgeon  Anesthesia Plan Comments: (Patient consented for risks of anesthesia including but not limited to:  - adverse reactions to medications - risk of airway placement if required - damage to eyes, teeth, lips or other oral mucosa - nerve damage due to positioning  - sore throat or hoarseness - Damage to heart, brain, nerves, lungs, other parts of body or loss of life  Patient voiced understanding and assent.)         Anesthesia Quick Evaluation

## 2023-11-17 NOTE — Transfer of Care (Signed)
 Immediate Anesthesia Transfer of Care Note  Patient: Joseph Frye  Procedure(s) Performed: COLONOSCOPY POLYPECTOMY, INTESTINE  Patient Location: PACU  Anesthesia Type:General  Level of Consciousness: sedated  Airway & Oxygen Therapy: Patient Spontanous Breathing  Post-op Assessment: Report given to RN and Post -op Vital signs reviewed and stable  Post vital signs: Reviewed and stable  Last Vitals:  Vitals Value Taken Time  BP    Temp 36.1 C 11/17/23 12:26  Pulse 72 11/17/23 12:26  Resp 13 11/17/23 12:26  SpO2 98 % 11/17/23 12:26  Vitals shown include unfiled device data.  Last Pain:  Vitals:   11/17/23 1226  TempSrc: Temporal  PainSc:          Complications: No notable events documented.

## 2023-11-18 LAB — SURGICAL PATHOLOGY

## 2024-01-11 ENCOUNTER — Ambulatory Visit (INDEPENDENT_AMBULATORY_CARE_PROVIDER_SITE_OTHER)

## 2024-01-11 DIAGNOSIS — Z23 Encounter for immunization: Secondary | ICD-10-CM

## 2024-03-06 ENCOUNTER — Telehealth: Payer: Self-pay

## 2024-03-06 NOTE — Telephone Encounter (Signed)
 Copied from CRM #[id number]. Topic: Appointments - Transfer of Care >> Mar 06, 2024  1:59 PM Delon HERO wrote: Patient's wife is calling to schedule a transfer of care appointment to [facility] because she heard that Dr. Donzella is leaving. Wife's appointment scheduled Monday May 09, 2023 at 1:40p.  Patient is calling to request TOC back to back May 09, 2203 at 2:20p. Please advise

## 2024-03-08 NOTE — Telephone Encounter (Signed)
 Called pt no answer, detailed message was left of new appt for TOC.

## 2024-04-27 ENCOUNTER — Ambulatory Visit: Admitting: Family Medicine

## 2024-04-27 ENCOUNTER — Encounter: Payer: Self-pay | Admitting: Family Medicine

## 2024-04-27 VITALS — BP 139/63 | HR 72 | Temp 97.6°F | Ht 69.02 in | Wt 238.1 lb

## 2024-04-27 DIAGNOSIS — E279 Disorder of adrenal gland, unspecified: Secondary | ICD-10-CM

## 2024-04-27 DIAGNOSIS — I10 Essential (primary) hypertension: Secondary | ICD-10-CM | POA: Diagnosis not present

## 2024-04-27 DIAGNOSIS — T466X5A Adverse effect of antihyperlipidemic and antiarteriosclerotic drugs, initial encounter: Secondary | ICD-10-CM

## 2024-04-27 DIAGNOSIS — Z8673 Personal history of transient ischemic attack (TIA), and cerebral infarction without residual deficits: Secondary | ICD-10-CM

## 2024-04-27 DIAGNOSIS — M791 Myalgia, unspecified site: Secondary | ICD-10-CM

## 2024-04-27 DIAGNOSIS — E782 Mixed hyperlipidemia: Secondary | ICD-10-CM

## 2024-04-27 DIAGNOSIS — R911 Solitary pulmonary nodule: Secondary | ICD-10-CM | POA: Diagnosis not present

## 2024-04-27 DIAGNOSIS — M10079 Idiopathic gout, unspecified ankle and foot: Secondary | ICD-10-CM

## 2024-04-27 DIAGNOSIS — J432 Centrilobular emphysema: Secondary | ICD-10-CM

## 2024-04-27 DIAGNOSIS — R7303 Prediabetes: Secondary | ICD-10-CM

## 2024-04-27 NOTE — Assessment & Plan Note (Signed)
 Chronic, well-controlled with colchicine  0.6 mg every other day without recent flares.  No changes today.  Check uric acid today.

## 2024-04-27 NOTE — Assessment & Plan Note (Addendum)
 Chronic stable on lisinopril  20 mg daily.  Blood pressure at goal <140/80.  No changes today.  Check CMP and uACR today.

## 2024-04-27 NOTE — Assessment & Plan Note (Addendum)
 Noted.  Continue aspirin 81 mg daily.  Continue to optimize risk factors.

## 2024-04-27 NOTE — Progress Notes (Signed)
 "     Established patient visit   Patient: Joseph Frye   DOB: 09-11-1950   74 y.o. Male  MRN: [id number] Visit Date: 04/27/2024  Today's healthcare provider: LAURAINE LOISE BUOY, DO   Chief Complaint  Patient presents with   Medical Management of Chronic Issues    Patient is here for a 6 month follow up, expresses no concerns.   Subjective    HPI Joseph Frye is a 74 year old male who presents for routine follow-up and management of his chronic conditions.  He has a history of gout and is currently taking colchicine  every other day. He reports no recent issues and feels it is better. No recent issues have been reported.  He experiences swelling in one leg for the past four to five years, which is not painful. The swelling improves when he does not wear socks in the summer. He has not used compression stockings.  He is currently taking Zetia  after switching from Robustatin due to adverse effects. He finds Zetia  effective and prefers it over the previous medication.  He maintains a healthy diet with very little fat and no added salt. Breakfast typically includes cereal with blueberries and bananas or scrambled eggs with toast. He walks regularly, including walking his dachshunds daily, and is waiting for warmer weather to resume walking in the [place].  No recent chest pain, shortness of breath, headaches, dizziness, lightheadedness, numbness, tingling, or problems with urination. No abnormal or racing heartbeats. He has not received a tetanus vaccine in the last ten years and does not plan to get a COVID booster.      Medications: Show/hide medication list[1]  Review of Systems  Respiratory: Negative.  Negative for cough, shortness of breath and wheezing.   Cardiovascular:  Positive for leg swelling (right lower extremity, chronic, stable). Negative for chest pain and palpitations.  Neurological:  Negative for dizziness, weakness, light-headedness and headaches.         Objective    BP 139/63 (BP Location: Left Arm, Patient Position: Sitting, Cuff Size: Large)   Pulse 72   Temp 97.6 F (36.4 C) (Oral)   Ht 5' 9.02 (1.753 m)   Wt 238 lb 1.6 oz (108 kg)   SpO2 98%   BMI 35.14 kg/m     Physical Exam Constitutional:      Appearance: Normal appearance.  HENT:     Head: Normocephalic and atraumatic.  Eyes:     General: No scleral icterus.    Extraocular Movements: Extraocular movements intact.     Conjunctiva/sclera: Conjunctivae normal.  Cardiovascular:     Rate and Rhythm: Normal rate and regular rhythm.     Pulses: Normal pulses.     Heart sounds: Normal heart sounds.  Pulmonary:     Effort: Pulmonary effort is normal. No respiratory distress.     Breath sounds: Normal breath sounds.  Abdominal:     General: Bowel sounds are normal.     Palpations: Abdomen is soft.  Musculoskeletal:     Right lower leg: No edema.     Left lower leg: No edema.  Skin:    General: Skin is warm and dry.  Neurological:     Mental Status: He is alert and oriented to person, place, and time. Mental status is at baseline.  Psychiatric:        Mood and Affect: Mood normal.        Behavior: Behavior normal.      No results found  for any visits on 04/27/24.  Assessment & Plan    Essential hypertension Assessment & Plan: Chronic stable on lisinopril  20 mg daily.  Blood pressure at goal <140/80.  No changes today.  Check CMP and uACR today.  Orders: -     Microalbumin / creatinine urine ratio -     Comprehensive metabolic panel with GFR  Mixed hyperlipidemia Assessment & Plan: Chronic, stable on ezetimibe  10 mg daily.  Patient did not tolerate rosuvastatin previously.  No changes today.  Recheck lipid panel.  Orders: -     Lipid panel  Myalgia due to statin  Prediabetes Assessment & Plan: Last A1c 5.9 on 07/12/2023.  Managed with diet and exercise. No acute concerns.  Continue to monitor.   Orders: -     Hemoglobin A1c  Idiopathic  gout involving toe, unspecified chronicity, unspecified laterality Assessment & Plan: Chronic, well-controlled with colchicine  0.6 mg every other day without recent flares.  No changes today.  Check uric acid today.  Orders: -     Uric acid  History of stroke Assessment & Plan: Noted.  Continue aspirin 81 mg daily.  Continue to optimize risk factors.   Lung nodule seen on imaging study Assessment & Plan: Due for recheck between 02/2025-08/2025.   Adrenal nodule Assessment & Plan: Due for recheck 08/2024.   Centrilobular emphysema (HCC) Assessment & Plan: Chronic, stable.  Noted on CT chest 08/02/2023.  Patient denies any problems with shortness of breath. No acute concerns.  Continue to monitor.    Obesity, morbid (HCC) Assessment & Plan: Noted.  BMI 35.15 with associated hypertension and mixed hyperlipidemia.  Counseled patient on lifestyle modifications, including heart healthy diet and regular physical activity.  Patient already adheres to low-fat diet, high in fresh foods and walks frequently.     General Health Maintenance Discussed prostate cancer screening, tetanus vaccination, and COVID booster. - Recommend prostate antigen check every two to five years, based on preference and lack of family history of prostate cancer. - Recommend tetanus vaccine at pharmacy.    Return in about 12 days (around 05/09/2024) for [facility] as scheduled with Dr. Franchot.      I discussed the assessment and treatment plan with the patient  The patient was provided an opportunity to ask questions and all were answered. The patient agreed with the plan and demonstrated an understanding of the instructions.   The patient was advised to call back or seek an in-person evaluation if the symptoms worsen or if the condition fails to improve as anticipated.    LAURAINE LOISE BUOY, DO  [facility] [phone] (phone) [phone] (fax)  [facility]      [1]  Outpatient Medications Prior to Visit  Medication Sig Note   aspirin EC 81 MG tablet Take 81 mg by mouth daily. Swallow whole.    clopidogrel  (PLAVIX ) 75 MG tablet Take 1 tablet (75 mg total) by mouth daily.    Coenzyme Q10 (CO Q10 PO) Take 1 capsule by mouth daily.    colchicine  0.6 MG tablet Take 1 tablet (0.6 mg total) by mouth every other day.    esomeprazole (NEXIUM) 20 MG packet Take 20 mg by mouth daily as needed.    ezetimibe  (ZETIA ) 10 MG tablet Take 1 tablet (10 mg total) by mouth daily.    lisinopril  (ZESTRIL ) 20 MG tablet Take 1 tablet (20 mg total) by mouth daily.    meloxicam  (MOBIC ) 15 MG tablet Take 1 tablet (15 mg  total) by mouth daily as needed for pain. MAX dose; do NOT take other NSAIDs with this.    Multiple Vitamins-Minerals (CENTRUM SILVER 50+MEN PO) Take 1 tablet by mouth daily.    nitroGLYCERIN (NITROSTAT) 0.4 MG SL tablet Place 0.4 mg under the tongue every 5 (five) minutes as needed for chest pain.    [DISCONTINUED] methocarbamol (ROBAXIN) 500 MG tablet Take 500 mg by mouth daily as needed. 04/27/2024: Took it when he had his gallbladder removed.   [DISCONTINUED] rosuvastatin (CRESTOR) 5 MG tablet Take 5 mg by mouth every other day. (Patient taking differently: Take 5 mg by mouth once a week.)    No facility-administered medications prior to visit.   "

## 2024-04-27 NOTE — Assessment & Plan Note (Signed)
 Last A1c 5.9 on 07/12/2023.  Managed with diet and exercise. No acute concerns.  Continue to monitor.

## 2024-04-27 NOTE — Assessment & Plan Note (Signed)
 Noted.  BMI 35.15 with associated hypertension and mixed hyperlipidemia.  Counseled patient on lifestyle modifications, including heart healthy diet and regular physical activity.  Patient already adheres to low-fat diet, high in fresh foods and walks frequently.

## 2024-04-27 NOTE — Assessment & Plan Note (Signed)
 Chronic, stable.  Noted on CT chest 08/02/2023.  Patient denies any problems with shortness of breath. No acute concerns.  Continue to monitor.

## 2024-04-27 NOTE — Assessment & Plan Note (Signed)
 Chronic, stable on ezetimibe  10 mg daily.  Patient did not tolerate rosuvastatin previously.  No changes today.  Recheck lipid panel.

## 2024-04-27 NOTE — Assessment & Plan Note (Signed)
 Due for recheck between 02/2025-08/2025.

## 2024-04-27 NOTE — Assessment & Plan Note (Signed)
 Due for recheck 08/2024.

## 2024-04-27 NOTE — Patient Instructions (Addendum)
 Recommended vaccine to obtain at pharmacy: Tdap (tetanus, diphtheria and pertussis)

## 2024-05-02 ENCOUNTER — Ambulatory Visit

## 2024-05-09 ENCOUNTER — Ambulatory Visit

## 2024-05-16 ENCOUNTER — Ambulatory Visit: Admitting: Cardiology

## 2024-07-10 ENCOUNTER — Encounter
